# Patient Record
Sex: Female | Born: 1950
Health system: Southern US, Community
[De-identification: ages and names within clinical notes are randomized; demographics above are authoritative.]

## PROBLEM LIST (undated history)

## (undated) DIAGNOSIS — F329 Major depressive disorder, single episode, unspecified: Secondary | ICD-10-CM

## (undated) DIAGNOSIS — M26629 Arthralgia of temporomandibular joint, unspecified side: Secondary | ICD-10-CM

## (undated) DIAGNOSIS — I82409 Acute embolism and thrombosis of unspecified deep veins of unspecified lower extremity: Secondary | ICD-10-CM

## (undated) DIAGNOSIS — F32A Depression, unspecified: Secondary | ICD-10-CM

## (undated) DIAGNOSIS — J309 Allergic rhinitis, unspecified: Secondary | ICD-10-CM

## (undated) DIAGNOSIS — K5790 Diverticulosis of intestine, part unspecified, without perforation or abscess without bleeding: Secondary | ICD-10-CM

## (undated) DIAGNOSIS — E559 Vitamin D deficiency, unspecified: Secondary | ICD-10-CM

## (undated) DIAGNOSIS — F419 Anxiety disorder, unspecified: Secondary | ICD-10-CM

## (undated) DIAGNOSIS — M1990 Unspecified osteoarthritis, unspecified site: Secondary | ICD-10-CM

## (undated) DIAGNOSIS — K589 Irritable bowel syndrome without diarrhea: Secondary | ICD-10-CM

## (undated) DIAGNOSIS — C4491 Basal cell carcinoma of skin, unspecified: Secondary | ICD-10-CM

## (undated) HISTORY — DX: Unspecified osteoarthritis, unspecified site: M19.90

## (undated) HISTORY — DX: Basal cell carcinoma of skin, unspecified: C44.91

## (undated) HISTORY — PX: TUBAL LIGATION: SHX77

## (undated) HISTORY — DX: Diverticulosis of intestine, part unspecified, without perforation or abscess without bleeding: K57.90

## (undated) HISTORY — DX: Acute embolism and thrombosis of unspecified deep veins of unspecified lower extremity: I82.409

## (undated) HISTORY — PX: SHOULDER SURGERY: SHX246

## (undated) HISTORY — DX: Arthralgia of temporomandibular joint, unspecified side: M26.629

## (undated) HISTORY — PX: FOOT SURGERY: SHX648

## (undated) HISTORY — PX: BREAST ENHANCEMENT SURGERY: SHX7

## (undated) HISTORY — DX: Anxiety disorder, unspecified: F41.9

## (undated) HISTORY — DX: Irritable bowel syndrome, unspecified: K58.9

## (undated) HISTORY — DX: Vitamin D deficiency, unspecified: E55.9

## (undated) HISTORY — PX: BREAST BIOPSY: SHX20

## (undated) HISTORY — DX: Allergic rhinitis, unspecified: J30.9

## (undated) HISTORY — DX: Depression, unspecified: F32.A

## (undated) HISTORY — DX: Major depressive disorder, single episode, unspecified: F32.9

## (undated) HISTORY — PX: VESICOVAGINAL FISTULA CLOSURE W/ TAH: SUR271

## (undated) HISTORY — PX: OTHER SURGICAL HISTORY: SHX169

---

## 1998-07-11 ENCOUNTER — Ambulatory Visit (HOSPITAL_COMMUNITY): Admission: RE | Admit: 1998-07-11 | Discharge: 1998-07-11 | Payer: Self-pay | Admitting: Ophthalmology

## 1999-01-12 ENCOUNTER — Encounter: Payer: Self-pay | Admitting: *Deleted

## 1999-01-12 ENCOUNTER — Inpatient Hospital Stay (HOSPITAL_COMMUNITY): Admission: EM | Admit: 1999-01-12 | Discharge: 1999-01-13 | Payer: Self-pay | Admitting: Emergency Medicine

## 2000-11-30 ENCOUNTER — Encounter: Payer: Self-pay | Admitting: Family Medicine

## 2000-11-30 ENCOUNTER — Ambulatory Visit (HOSPITAL_COMMUNITY): Admission: RE | Admit: 2000-11-30 | Discharge: 2000-11-30 | Payer: Self-pay | Admitting: Family Medicine

## 2001-03-29 ENCOUNTER — Other Ambulatory Visit: Admission: RE | Admit: 2001-03-29 | Discharge: 2001-03-29 | Payer: Self-pay | Admitting: *Deleted

## 2001-04-18 ENCOUNTER — Encounter: Payer: Self-pay | Admitting: Oral Surgery

## 2001-04-18 ENCOUNTER — Ambulatory Visit (HOSPITAL_COMMUNITY): Admission: RE | Admit: 2001-04-18 | Discharge: 2001-04-18 | Payer: Self-pay | Admitting: Oral Surgery

## 2002-05-15 ENCOUNTER — Observation Stay (HOSPITAL_COMMUNITY): Admission: RE | Admit: 2002-05-15 | Discharge: 2002-05-16 | Payer: Self-pay | Admitting: Orthopedic Surgery

## 2002-07-03 ENCOUNTER — Inpatient Hospital Stay (HOSPITAL_COMMUNITY): Admission: RE | Admit: 2002-07-03 | Discharge: 2002-07-06 | Payer: Self-pay | Admitting: Family Medicine

## 2002-07-03 ENCOUNTER — Encounter: Payer: Self-pay | Admitting: Emergency Medicine

## 2002-07-04 ENCOUNTER — Encounter: Payer: Self-pay | Admitting: Internal Medicine

## 2002-07-06 ENCOUNTER — Encounter: Payer: Self-pay | Admitting: Internal Medicine

## 2002-07-13 ENCOUNTER — Other Ambulatory Visit: Admission: RE | Admit: 2002-07-13 | Discharge: 2002-07-13 | Payer: Self-pay | Admitting: Family Medicine

## 2002-07-31 ENCOUNTER — Encounter: Payer: Self-pay | Admitting: Family Medicine

## 2002-07-31 ENCOUNTER — Ambulatory Visit (HOSPITAL_COMMUNITY): Admission: RE | Admit: 2002-07-31 | Discharge: 2002-07-31 | Payer: Self-pay | Admitting: Family Medicine

## 2002-08-17 ENCOUNTER — Encounter: Payer: Self-pay | Admitting: Gastroenterology

## 2002-08-17 ENCOUNTER — Ambulatory Visit (HOSPITAL_COMMUNITY): Admission: RE | Admit: 2002-08-17 | Discharge: 2002-08-17 | Payer: Self-pay | Admitting: Gastroenterology

## 2002-10-05 ENCOUNTER — Ambulatory Visit (HOSPITAL_COMMUNITY): Admission: EM | Admit: 2002-10-05 | Discharge: 2002-10-05 | Payer: Self-pay | Admitting: Emergency Medicine

## 2002-10-05 ENCOUNTER — Encounter (INDEPENDENT_AMBULATORY_CARE_PROVIDER_SITE_OTHER): Payer: Self-pay | Admitting: Specialist

## 2002-11-09 ENCOUNTER — Ambulatory Visit (HOSPITAL_COMMUNITY): Admission: RE | Admit: 2002-11-09 | Discharge: 2002-11-09 | Payer: Self-pay | Admitting: Gastroenterology

## 2003-12-05 ENCOUNTER — Emergency Department (HOSPITAL_COMMUNITY): Admission: EM | Admit: 2003-12-05 | Discharge: 2003-12-05 | Payer: Self-pay | Admitting: Family Medicine

## 2008-03-02 ENCOUNTER — Emergency Department (HOSPITAL_COMMUNITY): Admission: EM | Admit: 2008-03-02 | Discharge: 2008-03-02 | Payer: Self-pay | Admitting: Emergency Medicine

## 2009-10-07 ENCOUNTER — Other Ambulatory Visit: Admission: RE | Admit: 2009-10-07 | Discharge: 2009-10-07 | Payer: Self-pay | Admitting: Family Medicine

## 2010-09-19 ENCOUNTER — Encounter: Payer: Self-pay | Admitting: Family Medicine

## 2010-10-20 ENCOUNTER — Emergency Department (HOSPITAL_COMMUNITY): Payer: BC Managed Care – PPO

## 2010-10-20 ENCOUNTER — Inpatient Hospital Stay (HOSPITAL_COMMUNITY): Payer: BC Managed Care – PPO

## 2010-10-20 ENCOUNTER — Inpatient Hospital Stay (HOSPITAL_COMMUNITY)
Admission: EM | Admit: 2010-10-20 | Discharge: 2010-10-22 | DRG: 181 | Disposition: A | Discharge status: Home / Self Care | Payer: BC Managed Care – PPO | Attending: Surgery | Admitting: Surgery

## 2010-10-20 DIAGNOSIS — K589 Irritable bowel syndrome without diarrhea: Secondary | ICD-10-CM | POA: Diagnosis present

## 2010-10-20 DIAGNOSIS — F3289 Other specified depressive episodes: Secondary | ICD-10-CM | POA: Diagnosis present

## 2010-10-20 DIAGNOSIS — K56609 Unspecified intestinal obstruction, unspecified as to partial versus complete obstruction: Principal | ICD-10-CM | POA: Diagnosis present

## 2010-10-20 DIAGNOSIS — F329 Major depressive disorder, single episode, unspecified: Secondary | ICD-10-CM | POA: Diagnosis present

## 2010-10-20 DIAGNOSIS — G5760 Lesion of plantar nerve, unspecified lower limb: Secondary | ICD-10-CM | POA: Diagnosis present

## 2010-10-20 LAB — COMPREHENSIVE METABOLIC PANEL
ALT: 27 U/L (ref 0–35)
AST: 28 U/L (ref 0–37)
Alkaline Phosphatase: 101 U/L (ref 39–117)
CO2: 24 mEq/L (ref 19–32)
Chloride: 105 mEq/L (ref 96–112)
GFR calc Af Amer: >60 mL/min (ref >60)
GFR calc non Af Amer: >60 mL/min (ref >60)
Potassium: 3.8 mEq/L (ref 3.5–5.1)
Sodium: 143 mEq/L (ref 135–145)
Total Bilirubin: 0.6 mg/dL (ref 0.3–1.2)

## 2010-10-20 LAB — CBC
HCT: 43 % (ref 36.0–46.0)
Hemoglobin: 14.5 g/dL (ref 12.0–15.0)
MCH: 29.3 pg (ref 26.0–34.0)
MCHC: 33.7 g/dL (ref 30.0–36.0)
MCV: 86.9 fL (ref 78.0–100.0)
Platelets: 293 10*3/uL (ref 150–400)
RBC: 4.95 MIL/uL (ref 3.87–5.11)
RDW: 12.8 % (ref 11.5–15.5)
WBC: 10.6 10*3/uL — ABNORMAL HIGH (ref 4.0–10.5)

## 2010-10-20 LAB — DIFFERENTIAL
Basophils Absolute: 0.1 10*3/uL (ref 0.0–0.1)
Basophils Relative: 1 % (ref 0–1)
Eosinophils Absolute: 0.1 10*3/uL (ref 0.0–0.7)
Eosinophils Relative: 1 % (ref 0–5)
Lymphocytes Relative: 21 % (ref 12–46)
Lymphs Abs: 2.2 10*3/uL (ref 0.7–4.0)
Monocytes Absolute: 0.5 10*3/uL (ref 0.1–1.0)
Monocytes Relative: 4 % (ref 3–12)
Neutro Abs: 7.8 10*3/uL — ABNORMAL HIGH (ref 1.7–7.7)
Neutrophils Relative %: 74 % (ref 43–77)

## 2010-10-20 LAB — URINE MICROSCOPIC-ADD ON

## 2010-10-20 LAB — URINALYSIS, ROUTINE W REFLEX MICROSCOPIC
Bilirubin Urine: NEGATIVE
Protein, ur: NEGATIVE mg/dL
Specific Gravity, Urine: 1.01 (ref 1.005–1.030)
Urobilinogen, UA: 0.2 mg/dL (ref 0.0–1.0)

## 2010-10-20 MED ORDER — IOHEXOL 300 MG/ML  SOLN: 80.0000 mL | Freq: Once | INTRAMUSCULAR | Status: AC | PRN

## 2010-10-21 ENCOUNTER — Inpatient Hospital Stay (HOSPITAL_COMMUNITY): Payer: BC Managed Care – PPO

## 2010-10-21 LAB — CBC
HCT: 41.3 % (ref 36.0–46.0)
Platelets: 267 10*3/uL (ref 150–400)
RDW: 12.8 % (ref 11.5–15.5)
WBC: 9.8 10*3/uL (ref 4.0–10.5)

## 2010-10-21 LAB — BASIC METABOLIC PANEL
GFR calc non Af Amer: >60 mL/min (ref >60)
Glucose, Bld: 101 mg/dL — ABNORMAL HIGH (ref 70–99)
Potassium: 3.8 mEq/L (ref 3.5–5.1)
Sodium: 141 mEq/L (ref 135–145)

## 2010-10-21 LAB — MAGNESIUM: Magnesium: 2.3 mg/dL (ref 1.5–2.5)

## 2010-10-21 NOTE — H&P (Signed)
NAMECESILY, CUOCO              ACCOUNT NO.:  [id number]  MEDICAL RECORD NO.:  [id number]           PATIENT TYPE:  E  LOCATION:  [facility]                         FACILITY:  [facility]  PHYSICIAN:  Abigail Miyamoto, M.D. DATE OF BIRTH:  1951-06-01  DATE OF ADMISSION:  10/20/2010 DATE OF DISCHARGE:                             HISTORY & PHYSICAL   REFERRING PHYSICIAN:  Marisa Severin, MD  PRIMARY CARE PHYSICIAN:  Stacie Acres. White, MD  CHIEF COMPLAINT:  Abdominal pain, nausea, vomiting, abdominal distention since 6:30 p.m. yesterday.  BRIEF HISTORY:  The patient is a 60 year old white female who complains of abdominal epigastric pain that started after eating last night.  She describes it as a constant pain.  Initially, she got up and walked and tried a coke, burping helped, but then nausea and vomiting started around 11:00 p.m., she had nothing further except water.  She has had ongoing nausea and vomiting since 11:30 last night.  She has had no change in abdominal pain.  Her nausea and pain were improved after treatment in the ER with Zofran and fentanyl.  She has had a total of 12 mg of Zofran and 100 mcg of fentanyl.  She vomited during the CT, this is the largest volume so far and currently actually feels better.  She has a prior history of small-bowel obstruction, which she says the pain came in waves.  It was reported as a partial small bowel obstruction.  She denies any shortness of breath or chest discomfort.  PAST MEDICAL HISTORY: 1. Recent diagnosis of irritable bowel syndrome. 2. Prior small-bowel obstruction in November 09, 2002 with colonoscopy by     Dr. Matthias Hughs showed mild sigmoid diverticuli, internal hemorrhoids.     She had transaminitis with her small-bowel obstruction.  She has     undergone another colonoscopy in October 05, 2002, which showed     internal and external hemorrhoids, occasional erosions an ulcer.     Biopsies were obtained were reportedly negative.  She  most recently     had a colonoscopy in November 2010 at the [facility]     [facility].  She had some polyps removed which were reported     normal. 3. History of depression, on Zoloft. 4. History of vertigo.  PAST SURGICAL HISTORY: 1. She had a partial hysterectomy in 1985.  She had a tubal ligation     prior to that.  She has had right shoulder surgery. 2. Bilateral breast implants in 1979, which were later removed     secondary to leaks. 3. She had a spur of right fourth and fifth toes removed 4. She has had shoulder surgery on the right for spurs. 5. She has a Morton neuroma in the right foot.  FAMILY HISTORY:  She has one sister with a history of diverticulitis. One sister died at age 70 months with colitis.  One sister died of a "heart valve" rupture.  One sister died of unknown causes, perhaps an MI.  She has one brother deceased with liver cancer and one brother who died with lung cancer and mets.  Her mother  died of uterine cancer and her father died of lung cancer.  SOCIAL HISTORY:  She is married.  She has her own business.  Tobacco never.  Alcohol never.  Drugs never.  REVIEW OF SYSTEMS:  FEVER:  None.  SKIN:  No changes.  PSYCH:  No new changes.  Weight, she has changed her diet and has been down 12 pounds over a 4-week period.  CV:  She has a history of vertigo the last episode was 2 months ago.  No history of syncope, presyncope.  No history of stroke or seizure.  No headaches.  PULMONARY:  No orthopnea. No PND.  No dyspnea on exertion.  She does snore a bit, but has no apnea.  No coughing or wheezing.  No recent URI.  CARDIAC:  She has occasional palpitation.  She has undergone workup for that which included a stress test and monitoring, which were negative.  GI is positive for GERD currently, normally she does not have it.  Positive for nausea and vomiting starting last night.  Positive for occasional diarrhea and constipation.  No blood in her  stool.  Her last BM was sometime last night.  GU:  No trouble voiding.  LOWER EXTREMITIES:  No edema.  No claudication.  MUSCULOSKELETAL:  She has some arthritis in her hands.  CURRENT MEDICATIONS:  Zoloft 25 mg daily.  ALLERGIES:  MORPHINE causes her to be jumpy and irritable; TORADOL causes nausea and talks out of her head; PERCOCET causes nausea.  PHYSICAL EXAMINATION:  GENERAL:  This is well-nourished, well-developed white female, currently in no acute distress. VITAL SIGNS:  Temperature on admission at 5:47 was 98.4, heart rate was 102, blood pressure 136/62, sats of 97% on room air.  Currently her heart rate is 93, blood pressure is 116/65, respiratory rate is 20, sats 99% on room air. HEENT:  HEAD:  Normocephalic.  Ears, nose, throat, and mouth are all normal. NECK:  Trachea is in the midline.  Thyroid was nonpalpable.  There is no bruits.  No JVD. LUNGS:  Respiratory effort was normal.  Chest was clear to auscultation. CARDIAC:  Normal S1 and S2. ABDOMEN:  Rare bowel sounds.  Abdomen is nondistended, is not tender to palpation.  She has the same discomfort over the epigastric area, but is not made worse by palpation.  No masses, hernia, or abscess were noted. GENITALIA:  Deferred. RECTAL:  Deferred. LYMPHADENOPATHY:  No femoral lymphadenopathy noted. MUSCULOSKELETAL: No significant changes noted. SKIN:  No changes. NEUROLOGIC:  She is alert, oriented, cooperative.  Cranial nerves are intact. PSYCH:  Normal affect.  LABORATORY DATA:  White count is 10.6, hemoglobin is 14.5, hematocrit is 43, platelets 293,000.  Sodium is 143, potassium is 3.8, chloride is 105, CO2 is 24, BUN is 6, creatinine is 0.74, total bilirubin 0.6.  SGPT is 27, SGOT is 28, alk phos is 101, lipase is 21.  UA is positive for some bacteria and 0-2 white cells and 3-6 red cells per high-power field.  DIAGNOSTIC STUDIES:  CT scan of the abdomen and pelvis shows atelectasis at the lung bases.  The  liver, gallbladder, spleen, pancreas, and adrenals were normal.  There is a small hiatal hernia.  Duodenum was okay.  There is dilatation of small bowel with a left lower quadrant transition point here.  Terminal ileum was decompressed.  There is a small amount of free fluid in the pelvis.  There is colonic diverticulosis without diverticulitis.  IMPRESSION: 1. Small-bowel obstruction with prior small-bowel obstruction  in 2004. 2. Diverticulosis without diverticulitis. 3. History of tubal ligation and hysterectomy. 4. History of depression. 5. History of vertigo. 6. History of shoulder, breast, and toe surgery.  PLAN: 1. Bowel rest. 2. They failed to place an NG earlier, we will try this again later.     IV hydration, H2 blockers.  We will recheck her films and labs in     the morning with further workup and evaluation as indicated.     Eber Hong, P.A.   ______________________________ Abigail Miyamoto, M.D.    WDJ/MEDQ  D:  10/20/2010  T:  10/20/2010  Job:  [id number]  cc:   Stacie Acres. Cliffton Asters, M.D.  Electronically Signed by Sherrie George P.A. on 10/20/2010 04:31:39 PM Electronically Signed by Abigail Miyamoto M.D. on 10/21/2010 01:37:16 PM

## 2010-10-22 LAB — URINE CULTURE: Culture  Setup Time: 201202211543

## 2010-11-13 NOTE — Discharge Summary (Signed)
  [name], [name]              ACCOUNT NO.:  [id number]  MEDICAL RECORD NO.:  [id number]           PATIENT TYPE:  I  LOCATION:  5150                         FACILITY:  [facility]  PHYSICIAN:  [name], M.D. DATE OF BIRTH:  Nov 22, 1950  DATE OF ADMISSION:  10/20/2010 DATE OF DISCHARGE:  10/22/2010                              DISCHARGE SUMMARY   HISTORY OF PRESENT ILLNESS:  [name].  She presented to the emergency department where a CT scan was ordered showing evidence of at least partial small bowel obstruction.  She has previously had this back in 2004 and again recovered without surgical intervention. After evaluation in the emergency department, a decision was made to admit the patient for observation and possible surgical intervention.  SUMMARY OF HOSPITAL COURSE:  The patient was admitted on October 20, 2010.  An NG tube was placed and it hooked to low intermittent wall suction.  The patient was otherwise placed at bowel rest.  Followup x- rays the next day showed significant improvement and decompression of the small bowel loops.  The patient was passing gas and having evidence of improved bowel function.  Her x-ray did appear to show significant amount of retained stool throughout the colon.  The patient's NG tube was subsequently discontinued.  She was started on liquid diet.  She was given a suppository and subsequently a dose of MiraLax, which ultimately allowed the patient to have a significantly large bowel movement as of today.  After her significant bowel movement and continued flatus, the patient remains feeling well without nausea or [name].  At this point, she feels as well as [place] that she is stable for discharge home.  DISCHARGE DIAGNOSIS:  Partial small bowel obstruction - resolved.  PLAN:  The patient will be discharged home today.  She is instructed to follow up  with her primary care physician for any regular medical problems.  Certainly if in the next few days, she develops recurrent symptoms consistent with obstructive findings then she needs to contact our office or return to the emergency department immediately. Otherwise, her discharge medications will include Zoloft daily.     Joy El, PA-C   ______________________________ [name], M.D.    KB/MEDQ  D:  10/22/2010  T:  10/23/2010  Job:  [id number]  Electronically Signed by [name]  on 11/10/2010 11:00:21 AM Electronically Signed by [name] M.D. on 11/13/2010 08:45:21 AM

## 2011-01-15 NOTE — Consult Note (Signed)
NAME:  [name], [name]                        ACCOUNT NO.:  [id number]   MEDICAL RECORD NO.:  [id number]                   PATIENT TYPE:  INP   LOCATION:  0460                                 FACILITY:  [facility]   PHYSICIAN:  Bernette Redbird, M.D.                DATE OF BIRTH:  [birth year]   DATE OF CONSULTATION:  07/04/2002  DATE OF DISCHARGE:                                   CONSULTATION   REASON FOR CONSULTATION:  Dr. Nehemiah Settle of the [facility] hospitalists asked me to  see this 60 year old Caucasian female because of abdominal pain and abnormal  radiographic findings on a CT scan.  She also has elevated liver  chemistries.   HISTORY OF PRESENT ILLNESS:  The patient was admitted to the hospital  yesterday following a several day prodrome of low abdominal pain, mostly in  the infraumbilical area, which began fairly abruptly midday Sunday and  increased after lunch, associated with some degree of abdominal distention,  but no significant nausea, and certainly no vomiting.  It was difficult for  her to stand up straight or to walk around.  She had some bowel movements  which were quite painful, although after the bowel movements the pain was  transiently relieved.  The same is true that there was some degree of relief  following urination.  At this time the patient did not develop any evident  fevers or chills.  There was really no frank diarrhea.   The patient took some Darvocet and received partial relief to the point  where she was able to go to work the next morning at [employer].  However,  as the morning went on the pain came back and intensified.  She had to go  home a bit early from work and then yesterday morning she again had very  severe symptoms, made worse following breakfast, and presented to the office  of her primary physician, Dr. Charlott Rakes, where she was noted to have  abnormal blood work including a white count of 13,600 (72% polys) and  elevation of liver chemistries  (mixed hepatocellular and cholestatic pattern  with an alkaline phosphatase of 208 with normal being up to 136 and SGOT of  212 and SGPT of 254).   She was sent to the hospital where an abdominal ultrasound was obtained  which showed no gallstones or any other obvious liver abnormalities but  there was a small amount of perihepatic ascites.  She then underwent an  abdominal/pelvic CT scan which showed some dilatation of small bowel loops,  but passage of the contrast into the colon (and indeed she has passed the  contrast rectally through the course of the night), some questionable  thickening of the distal ileum (not terminal ileum), although on further  review with the radiologist this appears to be artifactual since it is not  circumferential thickening, no obvious hepatic parenchymal abnormalities,  varices, etc., some mild  to moderate fluid in the pelvis and a little bit  around the liver, and diverticular disease without overt diverticulitis.   Since hospital admission the patient has been on Cipro and Flagyl and  overnight she has had a marked (80%) reduction in her pain, although there  is still a little bit of soreness.  The patient has been seen in consultation by Dr. Kendrick Ranch of general  surgery.  His impression was probably a resolving SBO secondary to  inflammation of the distal ileum.  Of note, the patient denies any prodromal abdominal symptoms prior to the  acute onset of symptoms three days ago.  There is no antecedent history of  chronic diarrhea, intermittent abdominal pain, loss of appetite, loss of  weight (actually has gained about 20 pounds over the past year), etc.  The patient has had a maximum temperature of 100.3 degrees since admission.  She has been afebrile more recently.   ALLERGIES:  No known drug allergies.   MEDICATIONS:  Occasional Advil.   PAST SURGICAL HISTORY:  1. Remote open tubal ligation.  2. Remote abdominal hysterectomy without  oophorectomy.  3. Shoulder surgery two months ago.   PAST MEDICAL HISTORY:  None, specifically, no chronic cardiopulmonary  disease, diabetes, or hypertension.   HABITS:  Nonsmoker, nondrinker.   FAMILY HISTORY:  Pertinent for diverticulitis in one of her sisters,  negative for colon cancer, IBS, gallstones.  One of her brothers has ulcers.   SOCIAL HISTORY:  Married.  Works part-time at [employer].   REVIEW OF SYSTEMS:  The patient reports two bowel movements a day which is  her normal baseline.  No rectal bleeding.  Remainder of ROS is per HPI.   PHYSICAL EXAMINATION:  GENERAL:  Pertinent for heme-negative stool detected  yesterday by Dr. Nehemiah Settle and minimal suprapubic tenderness on examination at  present.  VITAL SIGNS:  The patient is afebrile.  HEENT:  Anicteric and without overt pallor.  CHEST:  Clear.  HEART:  Without gallops, rubs, murmurs, clicks, or arrhythmias.  ABDOMEN:  Normal, mildly reduced bowel sounds, nonobstructive in character.  There is some mild scattered tympany, nothing impressive.  The abdomen is  slightly protuberant, mostly due to adiposity, although the patient thinks  she is a little bit distended beyond her usual baseline.  No  hepatosplenomegaly is appreciated.  No discreet mass effect is noted.  There  is some suprapubic subjective tenderness without significant guarding,  without peritoneal findings.  RECTAL:  By Dr. Nehemiah Settle yesterday on admission showed heme-negative stool and  was not repeated.   LABORATORIES:  Please see HPI for laboratories yesterday in Dr. Lucilla Lame  office.  White count today is 9500 with a hemoglobin of 12.1 and an MCV of  87 with a normal RDW.  Platelets 266,000.  Metabolic panel pertinent for  post hydration albumin of 3.0 (was 4.1 on admission yesterday), AST 52, ALT  106 (both improved from yesterday when they were 146 and 179, respectively). Alkaline phosphatase today is normal at 101.  Yesterday it was slightly   elevated at 148.  Amylase normal.  Lipase below normal at 14.  Hepatitis  studies negative for hepatitis B surface antigen, hepatitis A IgM antibody,  and hepatitis C antibody.   IMPRESSION:  1. Abdominal pain which I think is probably due to resolving acute     diverticulitis with an associated reactive ileus.  I doubt a bowel     obstruction is present since the contrast moved into the colon and  the     patient never was without bowel movements during the time of her illness.     There was evidence of some degree of small intestinal dilatation and I     suspect there probably was an element of a reactive ileus to account for     that.  2. Elevated liver chemistries, predominantly hepatocellular pattern without     overt fatty infiltration of the liver noted on either ultrasound or CT     scan.  Note that worrisome features such as scalloping of the liver     surface, splenomegaly, or varices are absent.  3. Minimal ascites, primarily in the pelvis which I take to most likely be a     reactive phenomenon to the patient's presumed diverticulitis.  I doubt     this is primary hepatic ascites.  4. Questionable ileal thickening on CT scan which I think is probably     artifactual in character.  5. Diverticulosis by CT scan without frank radiographic diverticulitis.    RECOMMENDATIONS:  1. Okay to initiate clear liquid diet and advance to a low residue diet     tomorrow if doing okay.  2. Continue Cipro and Flagyl.  Could probably change to oral antibiotics     tomorrow if doing okay.  3. Check further for primary liver diseases with iron studies and an ANA     (doubt).  4. I am hopeful that the patient might be able to go home in a couple of     days if she shows continued improvement and can tolerate a diet.  5. I would favor a follow-up CT scan of the abdomen and pelvis in about     three weeks to confirm resolution of the pelvic fluid and to confirm     normal appearance of the  small bowel which if abnormal might generate the     need for a formal small bowel series.  6. The patient is a candidate for eventual screening colonoscopy.  She has     not had sigmoidoscopy in the past.  Even though there are no special risk     factors or worrisome features, I think that it would be prudent to help     exclude colorectal neoplasia, precancerous lesions, and smoldering     inflammatory bowel disease.                                               Bernette Redbird, M.D.    RB/MEDQ  D:  07/04/2002  T:  07/04/2002  Job:  [id number]   cc:   Stacie Acres. Cliffton Asters, M.D.   Timothy E. Earlene Plater, M.D.  [address] [place]  [place] [address]  Fax: [phone]

## 2011-01-15 NOTE — Op Note (Signed)
   NAME:  [name], PEDRETTI                        ACCOUNT NO.:  [id number]   MEDICAL RECORD NO.:  [id number]                   PATIENT TYPE:  OBV   LOCATION:  0450                                 FACILITY:  [facility]   PHYSICIAN:  Georges Lynch. Darrelyn Hillock, M.D.             DATE OF BIRTH:  11/28/1950   DATE OF PROCEDURE:  DATE OF DISCHARGE:                                 OPERATIVE REPORT   SURGEON:  Ronald A. Darrelyn Hillock, M.D.   Threasa HeadsWynona Canes.   PREOPERATIVE DIAGNOSES:  1. Small tear of the rotator cuff tendon, right shoulder.  2. Severe impingement syndrome, right shoulder.   POSTOPERATIVE DIAGNOSES:  1. Small tear of the rotator cuff tendon, right shoulder.  2. Severe impingement syndrome, right shoulder.   OPERATION:  1. Partial acrominonectomy and acromioplasty.  2. Excision of subdeltoid bursa, right shoulder.  3. Repair  of a small tear of the rotator cuff tendon. It was a longitudinal     tear, right shoulder.   DESCRIPTION OF PROCEDURE:  Under general anesthesia, routine orthopedic  prepping and draping of the  right shoulder was carried out. She had 1 gm of  IV Ancef preoperatively.   An incision was made over the anterior aspect of the right shoulder and the  bleeders were identified and cauterized. At this time I detached the deltoid  tendon from the acromion.  Following this I noticed she had severe  impingement. Her impingement was so severe that I could barely get a Cobb  elevator between her humeral head and her acromion.   I then went on  and protected the rotator cuff and did a partial  acromionectomy with the oscillating saw then utilized the bur to even out  the undersurface of the  acromion.  Following this I excised the subdeltoid  bursa, and then I noted by externally rotating the shoulder there was a  longitudinal tear in the rotator cuff, which actually was secondary to her  severe impingement. I then utilized a few transverse sutures to repair that.  No  graft was necessary.   I thoroughly irrigated out the shoulder, reapproximated the deltoid tendon  and the muscle in the usual fashion. The main part of the  wound was closed  in the usual fashion. Sterile dressings were applied. The patient left the  operating room in satisfactory condition.                                                    Ronald A. Darrelyn Hillock, M.D.    RAG/MEDQ  D:  05/15/2002  T:  05/16/2002  Job:  [id number]

## 2011-01-15 NOTE — Consult Note (Signed)
NAME:  AVANA, KREISER                        ACCOUNT NO.:  [id number]   MEDICAL RECORD NO.:  [id number]                   PATIENT TYPE:  EMS   LOCATION:  ED                                   [facility]   PHYSICIAN:  Petra Kuba, M.D.                 DATE OF BIRTH:  1951-08-26   DATE OF CONSULTATION:  DATE OF DISCHARGE:                                   CONSULTATION   HISTORY:  A patient of Dr. Matthias Hughs seen at the request of the ER physician  for some bloody mucus and some abdominal pain.  The patient presented to the  ER after talking to Dr. Matthias Hughs.  She had had a recent hospitalization back  in November for questionable diverticulitis.  She says she had recovered  from that and has seen Dr. Matthias Hughs in the office a few times.  There was  some question of abnormal liver tests which has since normalized.  She says  the pain that she is having now is different from her previous pain and she  was not having any blood or diarrhea before.  She did have a bout of  diarrhea two days ago, and about two weeks ago her and her husband both had  a seemingly 24-hour gastroenteritis.  She has not been on any antibiotics  that she knows of since September and has not seen any other obvious sick  contacts.  She has not had any previous GI procedures but her CAT scans with  her previous hospitalization.   PAST MEDICAL HISTORY:  Pertinent for multiple surgeries including partial  hysterectomy, she did leave her ovaries, breast surgery, and some moles  removed.   MEDICATIONS:  1. Advil.  2. Aspirin.  3. She does have an estrogen patch.   SOCIAL HISTORY:  Does not smoke or drink.   ALLERGIES:  MORPHINE, PERCOCET, and TORADOL.  She says she can take Demerol  and Valium.   FAMILY HISTORY:  Interesting for a sister with diverticulitis and a sister  who died at 43 months with some sort of colitis, sounds almost infectious.   REVIEW OF SYSTEMS:  Negative except as above, and she is  feeling better.   PHYSICAL EXAMINATION:  VITAL SIGNS:  Stable, afebrile, in no acute distress.  LUNGS:  Clear.  HEART:  Regular rate and rhythm.  ABDOMEN:  Soft and nontender.  RECTAL:  Trace guaiac-positive as per the ER physician.   LABORATORY DATA:  Completely normal including CBC, liver tests and amylase.   ASSESSMENT:  Acute onset of abdominal pain with bloody diarrhea and mucus.  Questionable infectious ischemia, etc.    PLAN:  The risks, benefits, methods of flexible sigmoidoscopy were discussed  with her and her husband.  We will proceed today with further workup and  plans and recommendations pending those findings.  Please see that dictation  per other recommendations.  Petra Kuba, M.D.    MEM/MEDQ  D:  10/05/2002  T:  10/05/2002  Job:  [id number]   cc:   Bernette Redbird, M.D.  [address]  [place], [place] [address]  Fax: [phone]   Stacie Acres. White, M.D.  [address]  [place]  [place] [address]  Fax: [phone]

## 2011-01-15 NOTE — H&P (Signed)
[name], [name]                          ACCOUNT NO.:  [id number]   MEDICAL RECORD NO.:  [id number]                   PATIENT TYPE:   LOCATION:                                       FACILITY:   PHYSICIAN:  [id number]                                DATE OF BIRTH:   DATE OF ADMISSION:  05/15/2002  DATE OF DISCHARGE:                                HISTORY & PHYSICAL   CHIEF COMPLAINT:  Right shoulder pain.   HISTORY OF PRESENT ILLNESS:  The patient has been complaining of right  shoulder pain for about the past four months.  She states that she was  loading about 15 tons of rock onto her bank to prevent her bank from running  down in the rain.  She also goes on to state that she has been lifting a  heavy father-in-law.  She denies any numbness or tingling distally.  She  denies any neck or axillary pain.  Her main complaint is pain on range of  motion of her shoulder.  Risks and benefits of open decompression of her  right shoulder have been discussed with the patient, and the patient wishes  to proceed.   PAST MEDICAL HISTORY:  No past medical history.   PAST SURGICAL HISTORY:  1. Breast implants.  2. Removal of breast implants.  3. Hysterectomy.  4. BTL.  5. Operations on two toes on her right foot.  6. Cyst removal of right breast.   MEDICATIONS:  No current medications.   ALLERGIES:  OXYCODONE causes nausea, MORPHINE makes her jumpy.   SOCIAL HISTORY:  The patient denies any alcohol or tobacco use.  She is  married.  She lives in a one-story house and her husband will be her  caregiver after surgery.   FAMILY HISTORY:  Mother deceased of uterine cancer.  Father deceased of lung  cancer and osteoarthritis.   REVIEW OF SYSTEMS:  GENERAL:  Denies fevers, chills, night sweats, bleeding,  __________.  Denies blurring, double vision, seizures, headaches, paralysis.  RESPIRATORY:  Denies shortness of breath, productive cough, hemoptysis.  CARDIOVASCULAR:  Denies chest  pain, angina, orthopnea.  GI:  Positive  diarrhea.  Denies nausea, vomiting, constipation, melena, bloody stools.  GU:  Denies dysuria, hematuria or discharge.  MUSCULOSKELETAL:  As pertinent  to HPI.   PHYSICAL EXAMINATION:  VITAL SIGNS:  Blood pressure 96/70, pulse 80,  respirations 16.  GENERAL:  A well-developed, well-nourished 60 year old female.  HEENT:  Normocephalic, atraumatic.  Pupils equal, round, reactive to light.  NECK:  Supple, no carotid bruit noted.  CHEST:  Clear to auscultation bilaterally, no wheezes or crackles.  HEART:  Regular rate and rhythm.  No murmurs, rubs, or gallops.  ABDOMEN:  Soft, nontender, nondistended.  Positive bowel sounds in all four  quadrants.  EXTREMITIES:  The patient on range  of motion on the right shoulder has  painful limited adduction on the right shoulder.  She is neurovascularly  intact distally.  No pain on palpation or range of motion of her neck.  SKIN:  No rashes or lesions.   IMAGING STUDIES:  Reveals subacromial subdeltoid bursitis with fraying of  rotator cuff.  No partial or full-thickness tear of rotator cuff.  She has a  lateral pointed acromion with subacromial spur.   IMPRESSION:  Impingement syndrome with rotator cuff fraying.   PLAN:  The patient will be admitted to [facility] on May 15, 2002 and undergo open decompression of the right shoulder with possible  graft (tendon graft).  The patient will also need to be placed on Demerol  p.o. postop as oxycodone causes nausea, and if a PCA is needed she will need  to be placed on Dilaudid PCA because morphine makes her jumpy.     Clarene Reamer, P.A.-C.                   [phone]    SW/MEDQ  D:  05/10/2002  T:  05/10/2002  Job:  [id number]

## 2011-01-15 NOTE — Discharge Summary (Signed)
[name], [name]                        ACCOUNT NO.:  [id number]   MEDICAL RECORD NO.:  [id number]                   PATIENT TYPE:  INP   LOCATION:  0460                                 FACILITY:  [facility]   PHYSICIAN:  Deirdre Peer. Polite, M.D.              DATE OF BIRTH:  11/06/1950   DATE OF ADMISSION:  07/03/2002  DATE OF DISCHARGE:                                 DISCHARGE SUMMARY   DISCHARGE DIAGNOSES:  1. Small-bowel obstruction, resolved, secondary to localized segment of     thick-walled distal ileus.  2. Transaminitis, improved at the time of discharge, AST and ALT of 212 and     254 respectively on admission, at discharge 22 and 48.  3. Abdominal ascites secondary to #1, CA125 ordered for completeness, which     was essentially normal 6.4.  Of note, CT was negative for any other     pathological cause for ascites.   DISCHARGE MEDICATIONS:  1. Cipro 500 mg p.o. b.i.d. x 7 days.  2. Flagyl 500 mg every eight hours x 7 days.  3. Percocet 1 or 2 tablets as needed for pain.  4. Phenergan 25 mg as needed for nausea or vomiting.   CONDITION ON DISCHARGE:  The patient is discharged in stable condition,  tolerating a p.o. diet.   FOLLOW-UP:  The patient will have follow-up with her primary M.D. on  July 13, 2002, at 2 p.m. and also have follow-up with Dr. Matthias Hughs on  August 07, 2002, at 11:45.   CONSULTATIONS:  1. Dr. Earlene Plater, surgery.  2. Dr. Matthias Hughs, gastroenterology.   LABORATORY DATA:  The patient had a CT of the abdomen and pelvis which  showed distal partial small-bowel obstruction secondary to localized segment  of thick-walled distal ileum.  Negative for free air.  Showed moderate  ascites.  Showed some sigmoid diverticulum but no evidence of  diverticulitis.  Abdominal flat plate performed on July 04, 2002, showed  improved partial small-bowel obstruction.   TSH 1.85.  BMET within normal limits.  CBC significant for white count 13.6,  hemoglobin of  14.9.  Urine dipstick performed in the office negative except  for mild ketones.  AST and ALT 212 and 254 respectively, bilirubin 0.5,  amylase 34, calcium 10.7, alkaline phosphatase 208.  The patient had  hepatitis studies A, B, and C which were negative.  The patient had an ANA  which was negative.  Urine culture negative.  CA125 6.4.   HISTORY OF PRESENT ILLNESS:  The patient is a 60 year old white female with  no significant past medical history came to the ED because of abdominal pain  and distention.  The patient had emesis x 1 after drinking p.o. contrast in  the ED.  Evaluation in the ED found the patient to have elevated LFTs with  an abnormal CT.  Admission was deemed necessary for further evaluation and  treatment.  Please see  full dictated H&P for further details of history of  present illness.   PAST MEDICAL HISTORY:  The patient denies diabetes.  Denies CVA.  No lung  disease.   MEDICATIONS:  Occasionally takes Advil for pain.  Darvocet.  Occasionally  takes B12; however, she has no documented deficiency.   SOCIAL HISTORY:  Negative for tobacco, alcohol, or drugs.   PAST SURGICAL HISTORY:  1. Right shoulder surgery secondary to bone spur May 15, 2002.  2. Bilateral breast implants in 1979.  3. Bilateral implants removed approximately nine years ago secondary to     leakage of implants.  4. Hysterectomy in 1985 secondary to fibroids.  5. Tubal ligation in 1980.  6. Bone spurs removed from the fifth digit of the feet bilateral.  7. Breast biopsy/lumpectomy of the right breast in the past which was     benign.   ALLERGIES:  The patient states intolerance to MORPHINE.   FAMILY HISTORY:  Noncontributory.   REVIEW OF SYSTEMS:  Please see dictated H&P.   HOSPITAL COURSE:  1. Abdominal pain and swelling consistent with small-bowel obstruction in a     patient with elevated white blood cells and abnormal CT scan with     localized segment of thick-walled ileum and  moderate ascites:     Differential diagnoses include infectious process versus other     pathological process, i.e., malignancy, as the patient had a significant     amount of ascites in her abdomen.  However, the CT of the abdomen did not     show any other cause for the ascites, i.e., mass, adenopathy, etc.  A     CA125 was ordered as the patient still has her ovaries, which was     essentially normal.  Therefore, it was felt that the patient's ascites     was all secondary to inflammatory process at the distal ileum.  The     patient was admitted to the medicine floor.  Surgery consultation was     obtained because of partial small-bowel obstruction.  The patient was     made n.p.o., given IV hydration, and started on empiric antibiotics in     the form of Cipro and Flagyl.  The patient had a follow-up abdominal flat     plate the next morning which showed moderate improvement of partial small-     bowel obstruction.  It was recommended that the patient have evaluation     by GI.  The patient was seen in consultation by Dr. Matthias Hughs.  It was felt     that the patient's symptoms may be related to diverticulitis that did not     show up on CT scan.  Nonetheless, the patient continued on the current     treatments as outlined.  She had dramatic improvement in her     symptomatology, improvement in her small-bowel obstruction.  No nausea or     vomiting, tolerance of p.o. intake.  The patient will continue a total of     10 days of antibiotics.  She will have a follow-up CT scan in two to     three weeks to insure that the inflamed area in the distal ileum is     resolved and the abdominal ascites is resolved.  Also, the patient will     have a follow-up by Dr. Matthias Hughs.  At this time, the patient is clinical     stable for discharge.  The patient will follow  up with her primary M.D.  2. Transaminitis:  As stated, the patient had elevated LFTs on admission,    AST and ALT in the 200s on  admission.  At the time of discharge AST and     ALT of 22 and 48 respectively.  All studies ordered were negative, i.e.,     hepatitis A, B, and C as well as ANA to rule out autoimmune process.  Of     note, the patient's CT scan of the abdomen had no signs of liver     abnormalities.  There was no cirrhosis and no fatty infiltration.     Presumably, the patient's transaminitis was secondary to inflammatory     process related to the bowel.  Probably recommended that the patient have     follow-up LFTs on an outpatient basis.  3. Abdominal ascites:  This is most likely secondary to inflammatory process     in the small bowel.  As stated, the patient did have a CA125 ordered, and     the test was essentially negative.  The patient's CT scan of the abdomen     did not reveal any other pathological process, i.e., mass or adenopathy.     It is expected that the patient's ascites will resolve with time.  Follow-     up CT is recommended in three weeks or further evaluation to take place     if indicated.  At this time the patient is noted to be stable for     discharge.   ADDENDUM:  It is recommended for the patient to have follow-up LFTs in  approximately two to three weeks and a follow-up CT scan in approximately  three weeks.                                               Deirdre Peer. Polite, M.D.    RDP/MEDQ  D:  07/06/2002  T:  07/06/2002  Job:  [id number]   cc:   Stacie Acres. Cliffton Asters, M.D.

## 2011-01-15 NOTE — Op Note (Signed)
NAME:  [name], [name]                        ACCOUNT NO.:  [id number]   MEDICAL RECORD NO.:  [id number]                   PATIENT TYPE:  EMS   LOCATION:  ED                                   FACILITY:  [facility]   PHYSICIAN:  Petra Kuba, M.D.                 DATE OF BIRTH:  1951/03/14   DATE OF PROCEDURE:  10/05/2002  DATE OF DISCHARGE:                                 OPERATIVE REPORT   PROCEDURE:  Flexible sigmoidoscopy with biopsy.   INDICATION:  Bloody diarrhea, abdominal pain.  Consent was signed after  risks, benefits, methods, options thoroughly discussed with both the patient  and her husband and with Bernette Redbird, M.D. in the office recently.   MEDICINES USED:  Demerol 25, Versed 9.   DESCRIPTION OF PROCEDURE:  Rectal inspection was pertinent for external  hemorrhoids, small.  Digital exam was negative.  The pediatric video  adjustable colonoscope was inserted.  Formed stool was seen throughout her  sigmoid.  We were able to carefully advance to 60 cm.  No blood was seen.  We elected to stop the procedure due to pain and formed stool and some  looping.  There was some left-sided diverticula seen on insertion.  On  withdrawal, a rare erosion and linear ulceration was seen.  Scattered  biopsies were obtained. This was mostly in the descending.  The sigmoid  appeared normal except for the diverticula.  No polypoid lesions or masses  were seen.  However, with the unprepped exam, lesions could have been  missed.  Once back in the rectum, the scope was retroflexed, pertinent for  some internal hemorrhoids.  The scope was straightened.  Air was suctioned  and scope removed.  The patient tolerated the procedure well.  There was no  obvious immediate complication.   ENDOSCOPIC DIAGNOSES:  1. Small internal-external hemorrhoids.  2. Left-sided diverticula.  3. Occasional descending erosion and ulcer, status post biopsy.  4. No blood seen on exam to 60 cm, stopped secondary to  pain and looping and     formed stool.   PLAN:  1. Await pathology.  2. Clear liquid, very slowly advance diet.  3. Call p.r.n.  4. Will discuss __________ with her on a full colonoscopy pending biopsies     in about a week or two per Bernette Redbird, M.D.                                               Petra Kuba, M.D.    MEM/MEDQ  D:  10/05/2002  T:  10/05/2002  Job:  [id number]   cc:   Stacie Acres. White, M.D.  [address]  [place]  [place] [address]  Fax: [phone]   Sheppard Plumber. Earlene Plater, M.D.  [address] [place]  [place] [address]  Fax: [phone]   Bernette Redbird, M.D.  [address]  [place], [place] [address]  Fax: [phone]

## 2011-01-15 NOTE — Op Note (Signed)
NAME:  [name], [name]                        ACCOUNT NO.:  [id number]   MEDICAL RECORD NO.:  [id number]                   PATIENT TYPE:  AMB   LOCATION:  ENDO                                 FACILITY:  [facility]   PHYSICIAN:  Bernette Redbird, M.D.                DATE OF BIRTH:  [birth year]   DATE OF PROCEDURE:  11/09/2002  DATE OF DISCHARGE:                                 OPERATIVE REPORT   PROCEDURE:  Colonoscopy.   INDICATIONS:  The patient is a very pleasant 60 year old female who  presented with an ileus/bowel obstruction picture associated with elevated  liver chemistries when she was an inpatient at [facility]  approximately three months ago.  She had a high white count at that time and  there was some questionable thickening of the distal ileum.  The patient  improved but then was seen in the emergency room about a month ago with  abdominal pain and bloody diarrhea, at which time sigmoidoscopic evaluation  to 60 cm by Petra Kuba, M.D., showed a rare mucosal ulceration and linear  ulceration.  Biopsies showed active colitis, raising the question of  ischemia.   At this time, the patient is asymptomatic except for some occasional small-  volume hematochezia that sounds compatible with hemorrhoidal bleeding.   FINDINGS:  Mild sigmoid diverticulosis and mild internal hemorrhoids.   DESCRIPTION OF PROCEDURE:  The patient provided written consent for the  procedure.  Sedation was fentanyl 75 mcg and Versed 4 mg IV without  arrhythmias or desaturation.  The Olympus adjustable-tension pediatric video  colonoscope was advanced through a slightly fixed and angulated sigmoid  region without much difficulty, all the way to the terminal ileum, which was  entered for a distance of perhaps 5 or 10 cm, after which pullback was  performed.  The TI had a normal appearance.  No biopsies were obtained  either in it or in the colon.   The quality of the prep was excellent, and it  is felt that all areas were  well-seen.   There was some mild sigmoid diverticulosis, but this was otherwise a normal  exam.  Specifically, there was no evidence of chronic colitis such as  erythema, ulcerations, friability, etc.  No polyps or masses were observed.  Retroflexion in the rectum as well as reinspection in the rectum was  unremarkable.  The patient tolerated the procedure well, and there were no  apparent complications.   IMPRESSION:  1. Mild sigmoid diverticulosis.  2. Resolution of mild inflammatory changes observed a month ago when she had     a clinical picture suggestive of mild or resolving ischemic colitis both     clinically and sigmoidoscopically.  3.     The patient did have some internal hemorrhoids seen during retroflexion and      pull-out through the anal canal, which probably account for her current  intermittent small-volume hematochezia.   PLAN:  Reassurance.  No specific follow-up needed.  Flexible sigmoidoscopy  for screening purposes in five years.                                               Bernette Redbird, M.D.    RB/MEDQ  D:  11/09/2002  T:  11/09/2002  Job:  [id number]   cc:   Stacie Acres. White, M.D.  [address]  [place]  [place] [address]  Fax: [phone]   Sheppard Plumber. Earlene Plater, M.D.  [address] [place]  [place] [address]  Fax: [phone]

## 2011-01-15 NOTE — H&P (Signed)
NAME:  [name], [name]                        ACCOUNT NO.:  [id number]   MEDICAL RECORD NO.:  [id number]                   PATIENT TYPE:  EMS   LOCATION:  ED                                   FACILITY:  [facility]   PHYSICIAN:  Deirdre Peer. Polite, M.D.              DATE OF BIRTH:  12-26-1950   DATE OF ADMISSION:  07/03/2002  DATE OF DISCHARGE:                                HISTORY & PHYSICAL   HISTORY OF PRESENT ILLNESS:  The patient is a 60 year old white female with  no significant past medical history comes to the ED now with abdominal pain,  distention since Sunday.  She denies any fever but some occasional chills.  She does admit to some nausea and emesis x 1 today after drinking contrast  to prepare for a CT scan.  The patient denies symptoms in the past of  dysuria, hematuria, but does admit to feeling gaseous and said that she gets  relief after defecation.  She also states that her last BMs have been  normal, without blood.  She denies any recent antibiotics.  New medications  include Advil and Darvocet, which she has been taking for pain.  In the ED  the patient was evaluated and found to have abnormalities in LFTs and an  abnormal CT scan showing partial small-bowel obstruction secondary to a  localized segment of thick-walled distal ileum.  There is no free air,  moderate ascites.  Also shows some sigmoid tics but no diverticulitis.  Admission was deemed necessary for further evaluation and treatment.   PAST MEDICAL HISTORY:  The patient denies diabetes, MI, CVA.  Denies lung  disease.   MEDICATIONS:  Advil once a week for pain.  Darvocet q.d.  No aspirin.  Occasionally takes B12; however, she has no documented deficiency.   SOCIAL HISTORY:  Negative for tobacco, alcohol, or drugs.  The patient works  part-time at [employer].   PAST SURGICAL HISTORY:  1. Significant for right shoulder surgery secondary to bone spur on     May 15, 2002.  2. Bilateral breast  implants in 1979.  3. Bilateral implants removal approximately nine years ago secondary to     leakage of the implants.  4. Hysterectomy in 1985, secondary to fibroids.  5. Tubal ligation in 1980.  6. Bone spurs removed from the fifth digit bilateral.  7. Breast biopsy/lumpectomy of the right breast in the past which was     benign.   ALLERGIES:  The patient denies any drug allergies but does admit to some  intolerance to MORPHINE.   FAMILY HISTORY:  Mother is deceased secondary to CA of the uterus.  Father  deceased secondary to CA of the lung.  She has four brothers.  One brother  had CA.  She has five sisters, three of whom are deceased; one from colitis,  one from heart problems.  Third sister, unaware of why her sister  is  deceased.   REVIEW OF SYSTEMS:  The patient has occasional dizziness when she has bouts  of vertigo.  She denies any headache.  No chest pain, no shortness of  breath.  No blood in stool, no blood in urine.  States she has gained 20  pounds in approximately 10 months.  Denied any night sweats.  No swollen  glands.  The patient does drink well water, but there are no other sick  contacts at home.  No recent travel.  No recent antibiotics.   PHYSICAL EXAMINATION:  GENERAL:  The patient is in mild distress secondary  to abdominal pain.  VITAL SIGNS:  BP 107/62, pulse 120, respiratory rate 20, temperature 98.1.  HEENT:  Pupils are equal, round, and reactive to light.  Anicteric.  There  are no oral lesions.  NECK:  Supple.  No adenopathy.  CHEST:  Clear to auscultation bilaterally.  CARDIOVASCULAR:  Regular S1, S2.  No S3.  ABDOMEN:  Distended.  There is no mass.  There is positive tympany.  There  is no rebound tenderness.  EXTREMITIES:  No edema.  The patient does have right shoulder pain with  movement which is secondary to prior surgery.  RECTAL:  Negative for mass.  Heme-negative.  NEUROLOGIC:  Nonfocal.   LABORATORY DATA:  CT scan of the abdomen is  described in HPI.   TSH 1.8.  BMET essentially normal.  CBC:  White count 13.6, hemoglobin 14.9,  platelets 340, neutrophils 72.  UA done in the office negative except for  mild ketones.  AST and ALT 212 and 254 respectively.  Amylase 34, calcium  10.7, bilirubin 0.5, alkaline phosphatase 208.   ASSESSMENT AND PLAN:  1. Abdominal pain, swelling in a patient with elevated white blood cells and     abnormal CT with localized segment of thick-walled ileum and ascites.     Differential diagnoses include infectious process versus malignancy with     ascites versus inflammatory bowel disease.  Doubtful that this is related     to adhesions.  The patient will be started on empiric antibiotics.  Will     obtain a surgical evaluation.  Check abdominal flat plate in the a.m.,     and check follow-up laboratories in the a.m.  2. Elevated liver function tests:  Most likely secondary to localized     inflammatory process versus other viral cause such as hepatitis.  Will     obtain a baseline hepatitis panel.  Further studies pending results of     follow-up laboratories.  3. Abdominal ascites:  This is most likely secondary to the localized     infection versus other pathological process.  However, the CT scan is     negative for any adenopathy, no mass or any other occult abscess.  We     will consider tapping the fluid if no improvement with the above     treatments with antibiotics.  Of note, there is no history of alcohol     abuse, and the CT scan does not show any signs of liver disease, i.e.,     cirrhosis, as cause for the patient's ascites.  CT has been reviewed with     the radiologist.  Deirdre Peer. Polite, M.D.    RDP/MEDQ  D:  07/04/2002  T:  07/04/2002  Job:  [id number]

## 2011-01-15 NOTE — Consult Note (Signed)
[name], [name]                          ACCOUNT NO.:  [id number]   MEDICAL RECORD NO.:  [id number]                    PATIENT TYPE:   LOCATION:                                       FACILITY:   PHYSICIAN:  [name], M.D.              DATE OF BIRTH:   DATE OF CONSULTATION:  DATE OF DISCHARGE:                                   CONSULTATION   HISTORY OF PRESENT ILLNESS:  The patient is a 60 year old Caucasian female  in the emergency room that I have been asked to see by Dr. Nehemiah [name].   PRIMARY CARE PHYSICIAN:  [name], M.D.   HISTORY OF PRESENT ILLNESS:  The patient is here because of abdominal pain  and distention. The patient, her husband, and her friend is in the room  during the examination. She relates that today, being Tuesday evening at  9:00 p.m., that on Sunday she began to feel unusual with the abdomen which  became bloated, distended and there was pain. The patient had breakfast on  Sunday. She had a steak mid day Sunday and a salad. She had a normal bowel  movement Sunday morning and later in the day, had loose stools. The  abdominal distention was variable during the day. She slept some on Sunday  night. On Monday morning she had a normal bowel movement. The pain was worse  and the abdominal distention was worse. She felt bloated and gaseous. She  had loose stools also later in the day on Sunday. She was able to work her  normal 6:00 a.m. until 11:00 a.m. shift at [employer] on Monday. The rest of  her day was not a good day. She did not sleep Monday night. She took an  enema on Tuesday morning with minimal results. Usually, she has bowel  movements on a regular basis. She does not use softeners, laxatives. Bowel  habits have never been a problem. She denies GU symptoms at this time. She  has never had an episode like this before. She was seen by an assistant at  Dr. Lucilla Lame office who sent her to the [facility]  emergency room where  she has been seen and evaluated by Dr. Nehemiah [name].   PAST MEDICAL HISTORY:  Is well documented by Dr. Nehemiah [name] but briefly, it  includes tubal ligation, abdominal hysterectomy, mammary implants (cosmetic)  many years ago. The mammary implants were removed nine years ago, suspect  leaking. Remote breast biopsy. Recent right shoulder surgery for bone spurs.   ALLERGIES:  No known drug allergies.   MEDICATIONS:  The patient takes no prescription drugs on a regular basis.  She has had some Advil recently and took Darvocet after her shoulder  surgery.   SOCIAL HISTORY:  The patient has not had any unusual exposures. She  completely and consistently denies any alcoholic beverages as does her  husband and her friend.  PHYSICAL EXAMINATION:  GENERAL: An ill appearing Caucasian female. No  jaundice.  SKIN: Warm to touch.  VITAL SIGNS: Temperature 99.  HEENT: Unremarkable for gross abnormalities.  CHEST: Clear to auscultation and percussion.  LUNGS: Equal breath sounds bilaterally.  CARDIAC: Heart rhythm is rapid at 100 and regular. No murmur, rub, or  gallop. Peripheral pulses are intact.  BREAST: Negative to palpation.  ABDOMEN: Rotund. Appears distended. It is however soft. Bowel sounds are  absent. There is a long horizontal scar in the Pfannenstiel position in the  lower abdomen. There is a smaller scar to the left of the midline, felt to  be tubal ligation scar. I do not detect evidence of ascites or free fluid.  There is minimal discomfort in the mid abdomen with palpation or percussion.  There are no masses. Liver is not palpable. Spleen not palpable. No hernias  are noted.   IMPRESSION:  I have reviewed the patient's chart, Dr. Idelle [name] notes, lab  data, and have again reviewed the CT scan with the radiologist. Her clinical  picture is a bit puzzling in that she has a clinical history compatible with  partial small bowel obstruction. However, the CT scan does show some  proximal  dilated small bowel loops. There is one segment of mid ileum that  is thickened. There is also free fluid in the pelvis around the liver. The  liver, gallbladder, and spleen appear normal on CT. Clinically, it is to be  noted that the patient passed the pink contrast taken P.O. for the CT scan  through the stool within an hour. Also, the elevated liver function studies,  low grade temperature, and malaise are reminiscent of hepatitis. Therefore,  I agree with Dr. Idelle [name] plan to repeat studies, complete the chemical  profiles and repeat the flat and upright of the abdomen in the morning. I  discussed this with the patient has her husband and they seem satisfied and  agree with our plan of attack.                                               [name], M.D.    TED/MEDQ  D:  07/03/2002  T:  07/03/2002  Job:  [id number]   cc:   [name], M.D.   [name], M.D.  [address] [place]  [place] [address]  Fax: [phone]

## 2013-11-20 ENCOUNTER — Other Ambulatory Visit: Payer: Self-pay | Admitting: Podiatry

## 2013-11-20 ENCOUNTER — Ambulatory Visit (INDEPENDENT_AMBULATORY_CARE_PROVIDER_SITE_OTHER): Payer: BC Managed Care – PPO

## 2013-11-20 ENCOUNTER — Encounter: Payer: Self-pay | Admitting: Podiatry

## 2013-11-20 ENCOUNTER — Ambulatory Visit (INDEPENDENT_AMBULATORY_CARE_PROVIDER_SITE_OTHER): Payer: BC Managed Care – PPO | Admitting: Podiatry

## 2013-11-20 VITALS — BP 127/80 | HR 84 | Resp 12

## 2013-11-20 DIAGNOSIS — R52 Pain, unspecified: Secondary | ICD-10-CM

## 2013-11-20 DIAGNOSIS — M722 Plantar fascial fibromatosis: Secondary | ICD-10-CM

## 2013-11-20 DIAGNOSIS — M6740 Ganglion, unspecified site: Secondary | ICD-10-CM

## 2013-11-20 MED ORDER — TRIAMCINOLONE ACETONIDE 10 MG/ML IJ SUSP
10.0000 mg | Freq: Once | INTRAMUSCULAR | Status: AC
  Administered 2013-11-20: 10 mg

## 2013-11-20 NOTE — Patient Instructions (Signed)

## 2013-11-20 NOTE — Progress Notes (Signed)
   Subjective:    Patient ID: [name], female    DOB: [birth year], 63 y.o.   MRN: [id number]  HPI PT STATED LT TOP OF THE FOOT HAVE A KNOT AND IS BEEN HURTING FOR 2 MONTHS. THE FOOT IS GETTING WORSE. THE FOOT GET AGGRAVATED BY PUTTING PRESSURE AND TRIED NO TREATMENT. ALSO RT FOOT HEEL IS HURTING FOR 5 MONTHS. THE FOOT IS GETTING WORSE. THE FOOT GET AGGRAVATED WHEN SIT AND STAND BACK UP AND TRIED NO TREATMENT.    Review of Systems  Constitutional: Positive for unexpected weight change.  HENT: Positive for hearing loss.   Eyes: Positive for pain and itching.  Gastrointestinal: Positive for diarrhea.  Endocrine: Positive for cold intolerance.       Objective:   Physical Exam        Assessment & Plan:

## 2013-11-21 NOTE — Progress Notes (Signed)
Subjective:     Patient ID: [name], female   DOB: Aug 18, 1951, 63 y.o.   MRN: [id number]  Foot Pain   patient states she has and not on top of her left foot which has been bothering her the last couple months and her heel on her right foot has been hurting her for 4-6 months and is causing her pain with ambulation and after sitting. She's tried soaks and she's tried shoe modifications without relief   Review of Systems  All other systems reviewed and are negative.       Objective:   Physical Exam  Nursing note and vitals reviewed. Constitutional: She is oriented to person, place, and time.  Cardiovascular: Intact distal pulses.   Musculoskeletal: Normal range of motion.  Neurological: She is oriented to person, place, and time.  Skin: Skin is warm.   neurovascular status intact with muscle strength adequate and range of motion within normal limits. Patient is found to have a high arched foot with a relatively narrow heel and pain at the plantar fascial insertion right. On the left I noted a small nodule on the lateral side of the dorsal foot measuring approximately 5 x 5 mm within subcutaneous tissue that is painful when pressed. Fill time to the toes is normal and no other pathology noted     Assessment:     Plantar fasciitis of the right heel with inflammation and probable nodule which may be a small ganglionic cyst or possible hemangioma or other unknown soft tissue lesion    Plan:     H&P and x-rays reviewed. Due to the long-term nature and this chronic structural foot condition she has I have recommended long-term orthotics and scanned for custom orthotics. I injected the right plantar fascia 3 mg Kenalog 5 mg Xylocaine Marcaine mixture and for the left I did a proximal nerve block I aspirated the lesion I was able to get out a small amount of gelatinous material which I expressed using palpation and then applied sterile dressing. Reappoint to recheck again in 2 weeks  when orthotics are ready and of the lesion remained symptomatic and painful we may need to consider removal

## 2013-12-04 ENCOUNTER — Ambulatory Visit: Payer: BC Managed Care – PPO | Admitting: Podiatry

## 2013-12-07 ENCOUNTER — Ambulatory Visit (INDEPENDENT_AMBULATORY_CARE_PROVIDER_SITE_OTHER): Payer: BC Managed Care – PPO | Admitting: Podiatry

## 2013-12-07 VITALS — Resp 16 | Ht 63.0 in | Wt 163.0 lb

## 2013-12-07 DIAGNOSIS — M722 Plantar fascial fibromatosis: Secondary | ICD-10-CM

## 2013-12-07 NOTE — Patient Instructions (Signed)

## 2013-12-07 NOTE — Progress Notes (Signed)
Picking up orthotics and went over wearing instructions. 

## 2014-04-02 ENCOUNTER — Ambulatory Visit: Payer: BC Managed Care – PPO | Admitting: Podiatry

## 2014-07-12 ENCOUNTER — Ambulatory Visit (INDEPENDENT_AMBULATORY_CARE_PROVIDER_SITE_OTHER): Payer: Medicare Other | Admitting: Podiatry

## 2014-07-12 VITALS — BP 104/56 | HR 87 | Resp 16

## 2014-07-12 DIAGNOSIS — M722 Plantar fascial fibromatosis: Secondary | ICD-10-CM

## 2014-07-12 MED ORDER — TRIAMCINOLONE ACETONIDE 10 MG/ML IJ SUSP
10.0000 mg | Freq: Once | INTRAMUSCULAR | Status: AC
  Administered 2014-07-12: 10 mg

## 2014-07-12 NOTE — Progress Notes (Signed)
Subjective:     Patient ID: [name], female   DOB: [birth year], 63 y.o.   MRN: [id number]  HPIpatient presents stating I need an injection in my right heel as it has been so sore and also I am having trouble getting up in the morning or after sitting due to the intense discomfort   Review of Systems     Objective:   Physical Exam Neurovascular status intact with extreme discomfort plantar aspect right heel at the insertional point of the tendon into the calcaneus with inflammation and fluid buildup noted patient also has a moderate diminishment of the fat pad    Assessment:     Plantar fasciitis right with inflammatory condition and reduced fat pad thickness    Plan:     I explained condition and we did one more very careful injection at the time with 2 mg dexamethasone Kenalog 5 mg Xylocaine and I then applied night splint with instructions on sleeping with it and then nighttime usage. Reappoint to recheck again in 3 weeks for evaluation

## 2014-08-02 ENCOUNTER — Ambulatory Visit (INDEPENDENT_AMBULATORY_CARE_PROVIDER_SITE_OTHER): Payer: Medicare Other | Admitting: Podiatry

## 2014-08-02 VITALS — BP 110/60 | HR 88 | Resp 16

## 2014-08-02 DIAGNOSIS — M722 Plantar fascial fibromatosis: Secondary | ICD-10-CM

## 2014-08-04 NOTE — Progress Notes (Signed)
Subjective:     Patient ID: [name], female   DOB: April 16, 1951, 63 y.o.   MRN: [id number]  HPI patient continues to experience severe discomfort in the right plantar heel despite numerous conservative care that we have rendered   Review of Systems     Objective:   Physical Exam Neurovascular status unchanged with significant discomfort medial fascial band right at the insertion of the tendon into the calcaneus    Assessment:     Plantar fasciitis right inflammation at the insertion of the tendon into the calcaneus    Plan:     Reviewed physical therapy and at this time discussed different aggressive treatment options. She has opted for shockwave therapy along with immobilization and today walking boot was dispensed with instructions on usage and next week we will commence the utilization of shockwave therapy

## 2014-08-06 ENCOUNTER — Telehealth: Payer: Self-pay | Admitting: Podiatry

## 2014-08-06 NOTE — Telephone Encounter (Signed)
Patient called and cancelled EPAT appt.Patient did not want to pay for EPAT with Christmas coming up. Patient said she would call back to set up an appt.

## 2014-08-09 ENCOUNTER — Ambulatory Visit: Payer: Medicare Other | Admitting: Podiatry

## 2014-09-12 ENCOUNTER — Encounter: Payer: Self-pay | Admitting: Podiatry

## 2014-09-12 ENCOUNTER — Ambulatory Visit (INDEPENDENT_AMBULATORY_CARE_PROVIDER_SITE_OTHER): Payer: PPO | Admitting: Podiatry

## 2014-09-12 VITALS — BP 127/71 | HR 95 | Resp 16

## 2014-09-12 DIAGNOSIS — M722 Plantar fascial fibromatosis: Secondary | ICD-10-CM | POA: Diagnosis not present

## 2014-09-12 NOTE — Patient Instructions (Signed)
Pre-Operative Instructions  Congratulations, you have decided to take an important step to improving your quality of life.  You can be assured that the doctors of [facility] will be with you every step of the way.  1. Plan to be at the surgery center/hospital at least 1 (one) hour prior to your scheduled time unless otherwise directed by the surgical center/hospital staff.  You must have a responsible adult accompany you, remain during the surgery and drive you home.  Make sure you have directions to the surgical center/hospital and know how to get there on time. 2. For hospital based surgery you will need to obtain a history and physical form from your family physician within 1 month prior to the date of surgery- we will give you a form for you primary physician.  3. We make every effort to accommodate the date you request for surgery.  There are however, times where surgery dates or times have to be moved.  We will contact you as soon as possible if a change in schedule is required.   4. No Aspirin/Ibuprofen for one week before surgery.  If you are on aspirin, any non-steroidal anti-inflammatory medications (Mobic, Aleve, Ibuprofen) you should stop taking it 7 days prior to your surgery.  You make take Tylenol  For pain prior to surgery.  5. Medications- If you are taking daily heart and blood pressure medications, seizure, reflux, allergy, asthma, anxiety, pain or diabetes medications, make sure the surgery center/hospital is aware before the day of surgery so they may notify you which medications to take or avoid the day of surgery. 6. No food or drink after midnight the night before surgery unless directed otherwise by surgical center/hospital staff. 7. No alcoholic beverages 24 hours prior to surgery.  No smoking 24 hours prior to or 24 hours after surgery. 8. Wear loose pants or shorts- loose enough to fit over bandages, boots, and casts. 9. No slip on shoes, sneakers are best. 10. Bring  your boot with you to the surgery center/hospital.  Also bring crutches or a walker if your physician has prescribed it for you.  If you do not have this equipment, it will be provided for you after surgery. 11. If you have not been contracted by the surgery center/hospital by the day before your surgery, call to confirm the date and time of your surgery. 12. Leave-time from work may vary depending on the type of surgery you have.  Appropriate arrangements should be made prior to surgery with your employer. 13. Prescriptions will be provided immediately following surgery by your doctor.  Have these filled as soon as possible after surgery and take the medication as directed. 14. Remove nail polish on the operative foot. 15. Wash the night before surgery.  The night before surgery wash the foot and leg well with the antibacterial soap provided and water paying special attention to beneath the toenails and in between the toes.  Rinse thoroughly with water and dry well with a towel.  Perform this wash unless told not to do so by your physician.  Enclosed: 1 Ice pack (please put in freezer the night before surgery)   1 Hibiclens skin cleaner   Pre-op Instructions  If you have any questions regarding the instructions, do not hesitate to call our office.  [place]: [address] [place], [place] [address] [phone]  [place]: [address] [place], [place] [address] [phone]  [place]: [address]  [place],  [address] [phone]  Dr. Richard   Tuchman DPM, Dr. Norman Regal DPM Dr. Richard Sikora DPM, Dr. M. Todd Hyatt DPM, Dr. Kathryn Egerton DPM 

## 2014-09-18 ENCOUNTER — Telehealth: Payer: Self-pay | Admitting: *Deleted

## 2014-09-18 NOTE — Telephone Encounter (Signed)
I called the patient to see if we could schedule her surgery on 10/01/2014 instead of 09/25/2014.  "That will be fine.  What time will it be?"  It will be sometime that morning.  The surgical center will call you with the exact time to arrive 1-2 days prior to date.  "Where do I go?  I didn't receive any information about that."  I will put a surgical pamphlet in the mail for you.  [place] pamphlet was put in outgoing mail.

## 2014-09-26 ENCOUNTER — Telehealth: Payer: Self-pay | Admitting: *Deleted

## 2014-09-26 NOTE — Telephone Encounter (Signed)
Pt states she didn't not get the surgical scrub for her surgery 10/01/2014.  I told the pt she could pick it up in [place] on Monday.  Pt agreed.

## 2014-10-01 ENCOUNTER — Encounter: Payer: Self-pay | Admitting: Podiatry

## 2014-10-01 DIAGNOSIS — M722 Plantar fascial fibromatosis: Secondary | ICD-10-CM

## 2014-10-07 ENCOUNTER — Encounter: Payer: PPO | Admitting: Podiatry

## 2014-10-08 ENCOUNTER — Ambulatory Visit (INDEPENDENT_AMBULATORY_CARE_PROVIDER_SITE_OTHER): Payer: PPO | Admitting: Podiatry

## 2014-10-08 ENCOUNTER — Encounter: Payer: Self-pay | Admitting: Podiatry

## 2014-10-08 VITALS — BP 128/69 | HR 127 | Resp 16

## 2014-10-08 DIAGNOSIS — M722 Plantar fascial fibromatosis: Secondary | ICD-10-CM

## 2014-10-08 NOTE — Progress Notes (Signed)
Subjective:     Patient ID: [name], female   DOB: [birth year], 64 y.o.   MRN: [id number]  HPI patient presents stating her heel is doing real well and she's walking without pain or swelling   Review of Systems     Objective:   Physical Exam One week after endoscopic fasciotomy right with wound edges well coapted negative Homan and minimal edema noted    Assessment:     Doing well post endoscopic surgery right heel    Plan:     Reapplied sterile dressing instructed on continued immobilization and reappoint 2 weeks for suture removal or earlier if necessary

## 2014-10-14 NOTE — Progress Notes (Signed)
DOS Right Endoscopic plantar fasciotomy.

## 2014-10-25 ENCOUNTER — Ambulatory Visit (INDEPENDENT_AMBULATORY_CARE_PROVIDER_SITE_OTHER): Payer: PPO | Admitting: Podiatry

## 2014-10-25 DIAGNOSIS — M722 Plantar fascial fibromatosis: Secondary | ICD-10-CM

## 2014-10-25 NOTE — Progress Notes (Signed)
Subjective:     Patient ID: [name], female   DOB: 03/21/1951, 64 y.o.   MRN: [id number]  HPI patient presents with well-healing surgical foot right stating she's having no pain with stitches intact medial and lateral heel   Review of Systems     Objective:   Physical Exam Neurovascular status intact with wound edges well coapted medial lateral heel right with stitches intact    Assessment:     Doing well post endoscopic fasciotomy right with negative Homans sign noted    Plan:     Stitches removed wound edges coapted well and sterile dressings applied. Dispensed anklet and instructed on gradual increase in activity and to reappoint if any symptoms should occur

## 2014-10-25 NOTE — Progress Notes (Signed)
All sutures removed today.

## 2014-10-31 ENCOUNTER — Encounter (HOSPITAL_COMMUNITY): Payer: Self-pay | Admitting: Emergency Medicine

## 2014-10-31 ENCOUNTER — Emergency Department (HOSPITAL_COMMUNITY)
Admission: EM | Admit: 2014-10-31 | Discharge: 2014-10-31 | Disposition: A | Discharge status: Home / Self Care | Payer: PPO | Attending: Emergency Medicine | Admitting: Emergency Medicine

## 2014-10-31 ENCOUNTER — Emergency Department (HOSPITAL_COMMUNITY): Payer: PPO

## 2014-10-31 DIAGNOSIS — Z85828 Personal history of other malignant neoplasm of skin: Secondary | ICD-10-CM | POA: Insufficient documentation

## 2014-10-31 DIAGNOSIS — K5792 Diverticulitis of intestine, part unspecified, without perforation or abscess without bleeding: Secondary | ICD-10-CM | POA: Insufficient documentation

## 2014-10-31 DIAGNOSIS — Z79899 Other long term (current) drug therapy: Secondary | ICD-10-CM | POA: Diagnosis not present

## 2014-10-31 DIAGNOSIS — E559 Vitamin D deficiency, unspecified: Secondary | ICD-10-CM | POA: Diagnosis not present

## 2014-10-31 DIAGNOSIS — Z8659 Personal history of other mental and behavioral disorders: Secondary | ICD-10-CM | POA: Diagnosis not present

## 2014-10-31 DIAGNOSIS — M1990 Unspecified osteoarthritis, unspecified site: Secondary | ICD-10-CM | POA: Diagnosis not present

## 2014-10-31 DIAGNOSIS — Z8719 Personal history of other diseases of the digestive system: Secondary | ICD-10-CM | POA: Diagnosis not present

## 2014-10-31 DIAGNOSIS — R52 Pain, unspecified: Secondary | ICD-10-CM

## 2014-10-31 DIAGNOSIS — R1032 Left lower quadrant pain: Secondary | ICD-10-CM | POA: Diagnosis present

## 2014-10-31 LAB — CBC WITH DIFFERENTIAL/PLATELET
BASOS ABS: 0.1 10*3/uL (ref 0.0–0.1)
Basophils Relative: 1 % (ref 0–1)
EOS ABS: 0.3 10*3/uL (ref 0.0–0.7)
Eosinophils Relative: 3 % (ref 0–5)
HCT: 39.1 % (ref 36.0–46.0)
Hemoglobin: 13 g/dL (ref 12.0–15.0)
LYMPHS ABS: 2.3 10*3/uL (ref 0.7–4.0)
Lymphocytes Relative: 24 % (ref 12–46)
MCH: 28.5 pg (ref 26.0–34.0)
MCHC: 33.2 g/dL (ref 30.0–36.0)
MCV: 85.7 fL (ref 78.0–100.0)
Monocytes Absolute: 0.8 10*3/uL (ref 0.1–1.0)
Monocytes Relative: 9 % (ref 3–12)
NEUTROS PCT: 63 % (ref 43–77)
Neutro Abs: 6.2 10*3/uL (ref 1.7–7.7)
PLATELETS: 312 10*3/uL (ref 150–400)
RBC: 4.56 MIL/uL (ref 3.87–5.11)
RDW: 13.1 % (ref 11.5–15.5)
WBC: 9.6 10*3/uL (ref 4.0–10.5)

## 2014-10-31 LAB — LIPASE, BLOOD: Lipase: 21 U/L (ref 11–59)

## 2014-10-31 LAB — COMPREHENSIVE METABOLIC PANEL
ALBUMIN: 3.6 g/dL (ref 3.5–5.2)
ALT: 51 U/L — ABNORMAL HIGH (ref 0–35)
AST: 31 U/L (ref 0–37)
Alkaline Phosphatase: 126 U/L — ABNORMAL HIGH (ref 39–117)
Anion gap: 9 (ref 5–15)
BUN: 8 mg/dL (ref 6–23)
CALCIUM: 9.3 mg/dL (ref 8.4–10.5)
CO2: 26 mmol/L (ref 19–32)
Chloride: 104 mmol/L (ref 96–112)
Creatinine, Ser: 0.62 mg/dL (ref 0.50–1.10)
GFR calc Af Amer: >90 mL/min (ref >90)
GFR calc non Af Amer: >90 mL/min (ref >90)
GLUCOSE: 100 mg/dL — AB (ref 70–99)
Potassium: 3.6 mmol/L (ref 3.5–5.1)
SODIUM: 139 mmol/L (ref 135–145)
Total Bilirubin: 0.6 mg/dL (ref 0.3–1.2)
Total Protein: 6.2 g/dL (ref 6.0–8.3)

## 2014-10-31 LAB — URINE MICROSCOPIC-ADD ON

## 2014-10-31 LAB — URINALYSIS, ROUTINE W REFLEX MICROSCOPIC
Bilirubin Urine: NEGATIVE
GLUCOSE, UA: NEGATIVE mg/dL
Ketones, ur: NEGATIVE mg/dL
LEUKOCYTES UA: NEGATIVE
Nitrite: NEGATIVE
PH: 7 (ref 5.0–8.0)
PROTEIN: NEGATIVE mg/dL
SPECIFIC GRAVITY, URINE: 1.015 (ref 1.005–1.030)
Urobilinogen, UA: 0.2 mg/dL (ref 0.0–1.0)

## 2014-10-31 MED ORDER — METRONIDAZOLE 500 MG PO TABS: 500.0000 mg | ORAL_TABLET | Freq: Four times a day (QID) | ORAL | Status: DC

## 2014-10-31 MED ORDER — HYDROCODONE-ACETAMINOPHEN 5-325 MG PO TABS: 1.0000 | ORAL_TABLET | Freq: Four times a day (QID) | ORAL | Status: DC | PRN

## 2014-10-31 MED ORDER — ONDANSETRON 4 MG PO TBDP: ORAL_TABLET | ORAL | Status: DC

## 2014-10-31 MED ORDER — IOHEXOL 300 MG/ML  SOLN
100.0000 mL | Freq: Once | INTRAMUSCULAR | Status: AC | PRN
Start: 2014-10-31 — End: 2014-10-31
  Administered 2014-10-31: 100 mL via INTRAVENOUS

## 2014-10-31 MED ORDER — METRONIDAZOLE IN NACL 5-0.79 MG/ML-% IV SOLN
500.0000 mg | Freq: Once | INTRAVENOUS | Status: AC
  Administered 2014-10-31: 500 mg via INTRAVENOUS
  Filled 2014-10-31: qty 100

## 2014-10-31 MED ORDER — HYDROMORPHONE HCL 1 MG/ML IJ SOLN
1.0000 mg | Freq: Once | INTRAMUSCULAR | Status: AC
  Administered 2014-10-31: 1 mg via INTRAVENOUS
  Filled 2014-10-31: qty 1

## 2014-10-31 MED ORDER — CIPROFLOXACIN IN D5W 400 MG/200ML IV SOLN
400.0000 mg | Freq: Once | INTRAVENOUS | Status: AC
  Administered 2014-10-31: 400 mg via INTRAVENOUS
  Filled 2014-10-31: qty 200

## 2014-10-31 MED ORDER — CIPROFLOXACIN HCL 500 MG PO TABS: 500.0000 mg | ORAL_TABLET | Freq: Two times a day (BID) | ORAL | Status: DC

## 2014-10-31 MED ORDER — IOHEXOL 300 MG/ML  SOLN
25.0000 mL | INTRAMUSCULAR | Status: AC
  Administered 2014-10-31: 25 mL via ORAL

## 2014-10-31 MED ORDER — ONDANSETRON HCL 4 MG/2ML IJ SOLN
4.0000 mg | Freq: Once | INTRAMUSCULAR | Status: AC
  Administered 2014-10-31: 4 mg via INTRAVENOUS
  Filled 2014-10-31: qty 2

## 2014-10-31 NOTE — Discharge Instructions (Signed)
Follow up with your md next week. °

## 2014-10-31 NOTE — ED Notes (Signed)
Patient coming from home with bilateral lower abdominal pain that has been ongoing since last Thursday with worsening pain.  Patient states that pain goes from the bilateral lower back around to the lower abdomin.  No pain at this time, but the pain is intermittent and comes in waves.

## 2014-10-31 NOTE — ED Provider Notes (Signed)
CSN: [id number]     Arrival date & time 10/31/14  6381 History   First MD Initiated Contact with Patient 10/31/14 [phone]     Chief Complaint  Patient presents with  . Abdominal Pain     (Consider location/radiation/quality/duration/timing/severity/associated sxs/prior Treatment) Patient is a 64 y.o. female presenting with abdominal pain. The history is provided by the patient (the pt complains fo lower abd pain).  Abdominal Pain Pain location:  LLQ and RLQ Pain quality: aching   Pain radiates to:  Does not radiate Pain severity:  Moderate Onset quality:  Gradual Timing:  Constant Progression:  Worsening Chronicity:  New Context: not alcohol use   Associated symptoms: no chest pain, no cough, no diarrhea, no fatigue and no hematuria     Past Medical History  Diagnosis Date  . Depression   . Anxiety   . OA (osteoarthritis)     HANDS  . Allergic rhinitis   . Diverticulosis     HEMMORHOIDS ON COLONOSCOPY  . TMJ syndrome   . Vitamin D deficiency   . IBS (irritable bowel syndrome)     DIARRHEA PRONE  . Skin cancer, basal cell     HX skin cancer per patient   Past Surgical History  Procedure Laterality Date  . Bone spurs removal      TOES  . Tubal ligation    . Breast biopsy      RIGHT  . Breast enhancement surgery      THEN REMOVED  . Vesicovaginal fistula closure w/ tah      WITHOUT OOPHS  . Shoulder surgery      RIGHT  . Foot surgery      Right   Family History  Problem Relation Age of Onset  . Hypertension Mother   . Cancer - Other Mother   . Lung cancer Father   . Cancer - Other Brother   . CVA Brother   . Heart attack Brother   . AAA (abdominal aortic aneurysm) Sister   . Depression Sister    History  Substance Use Topics  . Smoking status: Never Smoker   . Smokeless tobacco: Not on file  . Alcohol Use: No   OB History    No data available     Review of Systems  Constitutional: Negative for appetite change and fatigue.  HENT: Negative for  congestion, ear discharge and sinus pressure.   Eyes: Negative for discharge.  Respiratory: Negative for cough.   Cardiovascular: Negative for chest pain.  Gastrointestinal: Positive for abdominal pain. Negative for diarrhea.  Genitourinary: Negative for frequency and hematuria.  Musculoskeletal: Negative for back pain.  Skin: Negative for rash.  Neurological: Negative for seizures and headaches.  Psychiatric/Behavioral: Negative for hallucinations.      Allergies  Ketorolac tromethamine; Morphine and related; Percocet; and Zoloft  Home Medications   Prior to Admission medications   Medication Sig Start Date End Date Taking? Authorizing Provider  Cholecalciferol (VITAMIN D) 2000 UNITS CAPS Take 2,000 Units by mouth daily.   Yes Historical Provider, MD  ibuprofen (ADVIL,MOTRIN) 200 MG tablet Take 600-800 mg by mouth every 6 (six) hours as needed for moderate pain.    Yes Historical Provider, MD  ciprofloxacin (CIPRO) 500 MG tablet Take 1 tablet (500 mg total) by mouth 2 (two) times daily. One po bid x 7 days 10/31/14   Maudry Diego, MD  HYDROcodone-acetaminophen (NORCO/VICODIN) 5-325 MG per tablet Take 1 tablet by mouth every 6 (six) hours as needed for moderate  pain. 10/31/14   Maudry Diego, MD  metroNIDAZOLE (FLAGYL) 500 MG tablet Take 1 tablet (500 mg total) by mouth 4 (four) times daily. One po bid x 7 days 10/31/14   Maudry Diego, MD  ondansetron (ZOFRAN ODT) 4 MG disintegrating tablet 4mg  ODT q4 hours prn nausea/vomit 10/31/14   Maudry Diego, MD  Vitamin D, Ergocalciferol, (DRISDOL) 50000 UNITS CAPS capsule Take 50,000 Units by mouth every 7 (seven) days.    Historical Provider, MD   BP 120/54 mmHg  Pulse 91  Temp(Src) 98.8 F (37.1 C) (Oral)  Resp 22  Ht 5\' 4"  (1.626 m)  Wt 175 lb (79.379 kg)  BMI 30.02 kg/m2  SpO2 99% Physical Exam  Constitutional: She is oriented to person, place, and time. She appears well-developed.  HENT:  Head: Normocephalic.  Eyes:  Conjunctivae and EOM are normal. No scleral icterus.  Neck: Neck supple. No thyromegaly present.  Cardiovascular: Normal rate and regular rhythm.  Exam reveals no gallop and no friction rub.   No murmur heard. Pulmonary/Chest: No stridor. She has no wheezes. She has no rales. She exhibits no tenderness.  Abdominal: She exhibits no distension. There is tenderness. There is no rebound.  Musculoskeletal: Normal range of motion. She exhibits no edema.  Lymphadenopathy:    She has no cervical adenopathy.  Neurological: She is oriented to person, place, and time. She exhibits normal muscle tone. Coordination normal.  Skin: No rash noted. No erythema.  Psychiatric: She has a normal mood and affect. Her behavior is normal.    ED Course  Procedures (including critical care time) Labs Review Labs Reviewed  COMPREHENSIVE METABOLIC PANEL - Abnormal; Notable for the following:    Glucose, Bld 100 (*)    ALT 51 (*)    Alkaline Phosphatase 126 (*)    All other components within normal limits  URINALYSIS, ROUTINE W REFLEX MICROSCOPIC - Abnormal; Notable for the following:    Hgb urine dipstick SMALL (*)    All other components within normal limits  CBC WITH DIFFERENTIAL/PLATELET  LIPASE, BLOOD  URINE MICROSCOPIC-ADD ON    Imaging Review Ct Abdomen Pelvis W Contrast  10/31/2014   CLINICAL DATA:  Bilateral lower abdominal pain starting last Thursday with worsening pain  EXAM: CT ABDOMEN AND PELVIS WITH CONTRAST  TECHNIQUE: Multidetector CT imaging of the abdomen and pelvis was performed using the standard protocol following bolus administration of intravenous contrast.  CONTRAST:  171mL OMNIPAQUE IOHEXOL 300 MG/ML  SOLN  COMPARISON:  10/20/2010  FINDINGS: Sagittal images of the spine shows mild degenerative changes thoracolumbar spine.  Lung bases are unremarkable.  Enhanced liver shows no focal mass. Mild distended gallbladder without evidence of calcified gallstones. Enhanced pancreas, spleen and  adrenal glands are unremarkable. Enhanced kidneys are symmetrical in size. No hydronephrosis or hydroureter.  No aortic aneurysm.  Delayed renal images shows bilateral renal symmetrical excretion. Bilateral visualized proximal ureter is unremarkable.  No small bowel obstruction.  No ascites or free air.  No adenopathy.  There is no pericecal inflammation. The terminal ileum is unremarkable. Normal appendix partially visualized in coronal image 50.  Descending colon diverticula are noted. No evidence of acute diverticulitis. The patient is status post hysterectomy. Multiple sigmoid colon diverticula are noted. In axial image 78 there is mild thickening of colonic wall mid sigmoid colon in left pelvis with mild stranding of pericolonic fat. A inflamed colonic diverticulum is noted in axial image 75. Findings are consistent with acute diverticulitis or focal colitis. There  is no diverticular abscess. No pericolonic abscess. The urinary bladder is unremarkable.  IMPRESSION: 1. Multiple sigmoid colon diverticula are noted. There is short segment thickening of mid sigmoid colon wall and stranding of pericolonic fat. Mild inflamed colonic diverticulum is noted at this level in axial image 75. Findings are consistent with acute diverticulitis or focal colitis. There is no evidence of diverticular or pericolonic abscess. 2. Status post hysterectomy. There are descending colon diverticula. No evidence of acute diverticulitis. 3. Normal appendix.  No pericecal inflammation. 4. No hydronephrosis or hydroureter. Bilateral renal symmetrical excretion. 5. Degenerative changes thoracolumbar spine.   Electronically Signed   By: Lahoma Crocker M.D.   On: 10/31/2014 10:01     EKG Interpretation None      MDM   Final diagnoses:  Pain  Acute diverticulitis    Diverticulitis,   tx with flagyl, cipro, vicodin, zofran and follow up with pcp next week    Maudry Diego, MD 10/31/14 1029

## 2014-11-02 ENCOUNTER — Emergency Department (HOSPITAL_COMMUNITY): Payer: PPO

## 2014-11-02 ENCOUNTER — Emergency Department (HOSPITAL_COMMUNITY)
Admission: EM | Admit: 2014-11-02 | Discharge: 2014-11-02 | Disposition: A | Discharge status: Home / Self Care | Payer: PPO | Attending: Emergency Medicine | Admitting: Emergency Medicine

## 2014-11-02 ENCOUNTER — Encounter (HOSPITAL_COMMUNITY): Payer: Self-pay | Admitting: Emergency Medicine

## 2014-11-02 DIAGNOSIS — E559 Vitamin D deficiency, unspecified: Secondary | ICD-10-CM | POA: Diagnosis not present

## 2014-11-02 DIAGNOSIS — Z8659 Personal history of other mental and behavioral disorders: Secondary | ICD-10-CM | POA: Diagnosis not present

## 2014-11-02 DIAGNOSIS — M1990 Unspecified osteoarthritis, unspecified site: Secondary | ICD-10-CM | POA: Diagnosis not present

## 2014-11-02 DIAGNOSIS — Z85828 Personal history of other malignant neoplasm of skin: Secondary | ICD-10-CM | POA: Insufficient documentation

## 2014-11-02 DIAGNOSIS — M549 Dorsalgia, unspecified: Secondary | ICD-10-CM | POA: Insufficient documentation

## 2014-11-02 DIAGNOSIS — Z8719 Personal history of other diseases of the digestive system: Secondary | ICD-10-CM | POA: Insufficient documentation

## 2014-11-02 DIAGNOSIS — R1030 Lower abdominal pain, unspecified: Secondary | ICD-10-CM | POA: Diagnosis not present

## 2014-11-02 DIAGNOSIS — Z792 Long term (current) use of antibiotics: Secondary | ICD-10-CM | POA: Insufficient documentation

## 2014-11-02 DIAGNOSIS — R109 Unspecified abdominal pain: Secondary | ICD-10-CM

## 2014-11-02 DIAGNOSIS — M5489 Other dorsalgia: Secondary | ICD-10-CM

## 2014-11-02 DIAGNOSIS — Z79899 Other long term (current) drug therapy: Secondary | ICD-10-CM | POA: Diagnosis not present

## 2014-11-02 LAB — COMPREHENSIVE METABOLIC PANEL
ALBUMIN: 3.7 g/dL (ref 3.5–5.2)
ALK PHOS: 114 U/L (ref 39–117)
ALT: 39 U/L — ABNORMAL HIGH (ref 0–35)
ANION GAP: 8 (ref 5–15)
AST: 24 U/L (ref 0–37)
BUN: 7 mg/dL (ref 6–23)
CO2: 25 mmol/L (ref 19–32)
Calcium: 9.3 mg/dL (ref 8.4–10.5)
Chloride: 106 mmol/L (ref 96–112)
Creatinine, Ser: 0.57 mg/dL (ref 0.50–1.10)
GFR calc Af Amer: >90 mL/min (ref >90)
GFR calc non Af Amer: >90 mL/min (ref >90)
GLUCOSE: 100 mg/dL — AB (ref 70–99)
POTASSIUM: 3.3 mmol/L — AB (ref 3.5–5.1)
SODIUM: 139 mmol/L (ref 135–145)
Total Bilirubin: 0.5 mg/dL (ref 0.3–1.2)
Total Protein: 6.6 g/dL (ref 6.0–8.3)

## 2014-11-02 LAB — URINE MICROSCOPIC-ADD ON

## 2014-11-02 LAB — CBC WITH DIFFERENTIAL/PLATELET
BASOS PCT: 1 % (ref 0–1)
Basophils Absolute: 0.1 10*3/uL (ref 0.0–0.1)
Eosinophils Absolute: 0.2 10*3/uL (ref 0.0–0.7)
Eosinophils Relative: 2 % (ref 0–5)
HCT: 39.2 % (ref 36.0–46.0)
Hemoglobin: 12.9 g/dL (ref 12.0–15.0)
Lymphocytes Relative: 23 % (ref 12–46)
Lymphs Abs: 2.2 10*3/uL (ref 0.7–4.0)
MCH: 28.5 pg (ref 26.0–34.0)
MCHC: 32.9 g/dL (ref 30.0–36.0)
MCV: 86.5 fL (ref 78.0–100.0)
Monocytes Absolute: 0.8 10*3/uL (ref 0.1–1.0)
Monocytes Relative: 9 % (ref 3–12)
NEUTROS ABS: 6.1 10*3/uL (ref 1.7–7.7)
NEUTROS PCT: 65 % (ref 43–77)
PLATELETS: 324 10*3/uL (ref 150–400)
RBC: 4.53 MIL/uL (ref 3.87–5.11)
RDW: 13 % (ref 11.5–15.5)
WBC: 9.4 10*3/uL (ref 4.0–10.5)

## 2014-11-02 LAB — URINALYSIS, ROUTINE W REFLEX MICROSCOPIC
BILIRUBIN URINE: NEGATIVE
Glucose, UA: NEGATIVE mg/dL
KETONES UR: NEGATIVE mg/dL
Leukocytes, UA: NEGATIVE
Nitrite: NEGATIVE
PROTEIN: NEGATIVE mg/dL
Specific Gravity, Urine: 1.004 — ABNORMAL LOW (ref 1.005–1.030)
UROBILINOGEN UA: 0.2 mg/dL (ref 0.0–1.0)
pH: 7.5 (ref 5.0–8.0)

## 2014-11-02 LAB — CK: Total CK: 72 U/L (ref 7–177)

## 2014-11-02 LAB — LIPASE, BLOOD: Lipase: 20 U/L (ref 11–59)

## 2014-11-02 MED ORDER — SODIUM CHLORIDE 0.9 % IV BOLUS (SEPSIS)
1000.0000 mL | Freq: Once | INTRAVENOUS | Status: AC
  Administered 2014-11-02: 1000 mL via INTRAVENOUS

## 2014-11-02 MED ORDER — ONDANSETRON HCL 4 MG/2ML IJ SOLN
4.0000 mg | Freq: Once | INTRAMUSCULAR | Status: AC
Start: 2014-11-02 — End: 2014-11-02
  Administered 2014-11-02: 4 mg via INTRAVENOUS
  Filled 2014-11-02: qty 2

## 2014-11-02 MED ORDER — POTASSIUM CHLORIDE CRYS ER 20 MEQ PO TBCR
60.0000 meq | EXTENDED_RELEASE_TABLET | Freq: Once | ORAL | Status: AC
  Administered 2014-11-02: 60 meq via ORAL
  Filled 2014-11-02: qty 3

## 2014-11-02 MED ORDER — HYDROMORPHONE HCL 1 MG/ML IJ SOLN
1.0000 mg | Freq: Once | INTRAMUSCULAR | Status: AC
  Administered 2014-11-02: 1 mg via INTRAVENOUS
  Filled 2014-11-02: qty 1

## 2014-11-02 MED ORDER — DIAZEPAM 2 MG PO TABS: 2.0000 mg | ORAL_TABLET | ORAL | Status: DC | PRN

## 2014-11-02 MED ORDER — PREDNISONE 20 MG PO TABS: 40.0000 mg | ORAL_TABLET | Freq: Every day | ORAL | Status: DC

## 2014-11-02 NOTE — ED Notes (Signed)
Pt remains in bathroom.  Husband with pt.

## 2014-11-02 NOTE — ED Notes (Signed)
Pt transported to CT ?

## 2014-11-02 NOTE — ED Provider Notes (Signed)
Patient signed out to me by Dr. Claudine Mouton an MRI results reviewed with patient. Patient already has opiates at home and will add Valium low-dose. Neurological exam stable at discharge.  Leota Jacobsen, MD 11/02/14 [phone]

## 2014-11-02 NOTE — ED Provider Notes (Signed)
CSN: [id number]     Arrival date & time 11/02/14  0320 History   First MD Initiated Contact with Patient 11/02/14 [phone]     Chief Complaint  Patient presents with  . Flank Pain     (Consider location/radiation/quality/duration/timing/severity/associated sxs/prior Treatment) HPI  [name] is a 64 y.o. female with past medical history of basal cell skin cancer presenting today with right-sided flank pain. Patient is a bounce back to the emergency department. She states she was here a couple of days ago and diagnosed with diverticulitis, she has been taking her antibiotics. Her bilateral lower quadrant abdominal pain has resolved however she is now having worsening right flank pain. This woke her up out of sleep. She denies any nausea vomiting or changes in her urination or bowel movements. She also has right lower extremity weakness associated with this. Patient states the pain was so severe she almost fell over. She denies taking any medications, movement makes the pain worse. Patient denies any fevers. She has no further complaints.  10 Systems reviewed and are negative for acute change except as noted in the HPI.     Past Medical History  Diagnosis Date  . Depression   . Anxiety   . OA (osteoarthritis)     HANDS  . Allergic rhinitis   . Diverticulosis     HEMMORHOIDS ON COLONOSCOPY  . TMJ syndrome   . Vitamin D deficiency   . IBS (irritable bowel syndrome)     DIARRHEA PRONE  . Skin cancer, basal cell     HX skin cancer per patient   Past Surgical History  Procedure Laterality Date  . Bone spurs removal      TOES  . Tubal ligation    . Breast biopsy      RIGHT  . Breast enhancement surgery      THEN REMOVED  . Vesicovaginal fistula closure w/ tah      WITHOUT OOPHS  . Shoulder surgery      RIGHT  . Foot surgery      Right   Family History  Problem Relation Age of Onset  . Hypertension Mother   . Cancer - Other Mother   . Lung cancer Father   . Cancer -  Other Brother   . CVA Brother   . Heart attack Brother   . AAA (abdominal aortic aneurysm) Sister   . Depression Sister    History  Substance Use Topics  . Smoking status: Never Smoker   . Smokeless tobacco: Not on file  . Alcohol Use: No   OB History    No data available     Review of Systems    Allergies  Ketorolac tromethamine; Morphine and related; Percocet; and Zoloft  Home Medications   Prior to Admission medications   Medication Sig Start Date End Date Taking? Authorizing Provider  Cholecalciferol (VITAMIN D) 2000 UNITS CAPS Take 2,000 Units by mouth daily.    Historical Provider, MD  ciprofloxacin (CIPRO) 500 MG tablet Take 1 tablet (500 mg total) by mouth 2 (two) times daily. One po bid x 7 days 10/31/14   Maudry Diego, MD  HYDROcodone-acetaminophen (NORCO/VICODIN) 5-325 MG per tablet Take 1 tablet by mouth every 6 (six) hours as needed for moderate pain. 10/31/14   Maudry Diego, MD  ibuprofen (ADVIL,MOTRIN) 200 MG tablet Take 600-800 mg by mouth every 6 (six) hours as needed for moderate pain.     Historical Provider, MD  metroNIDAZOLE (FLAGYL) 500  MG tablet Take 1 tablet (500 mg total) by mouth 4 (four) times daily. One po bid x 7 days 10/31/14   Maudry Diego, MD  ondansetron (ZOFRAN ODT) 4 MG disintegrating tablet 4mg  ODT q4 hours prn nausea/vomit 10/31/14   Maudry Diego, MD  Vitamin D, Ergocalciferol, (DRISDOL) 50000 UNITS CAPS capsule Take 50,000 Units by mouth every 7 (seven) days.    Historical Provider, MD   BP 126/54 mmHg  Pulse 78  Temp(Src) 98.1 F (36.7 C) (Oral)  Resp 18  Ht 5\' 4"  (1.626 m)  Wt 175 lb (79.379 kg)  BMI 30.02 kg/m2  SpO2 97% Physical Exam  Constitutional: She is oriented to person, place, and time. She appears well-developed and well-nourished. No distress.  HENT:  Head: Normocephalic and atraumatic.  Nose: Nose normal.  Mouth/Throat: Oropharynx is clear and moist. No oropharyngeal exudate.  Eyes: Conjunctivae and EOM are  normal. Pupils are equal, round, and reactive to light. No scleral icterus.  Neck: Normal range of motion. Neck supple. No JVD present. No tracheal deviation present. No thyromegaly present.  Cardiovascular: Normal rate, regular rhythm and normal heart sounds.  Exam reveals no gallop and no friction rub.   No murmur heard. Pulmonary/Chest: Effort normal and breath sounds normal. No respiratory distress. She has no wheezes. She exhibits no tenderness.  Abdominal: Soft. Bowel sounds are normal. She exhibits no distension and no mass. There is no tenderness. There is no rebound and no guarding.  Musculoskeletal: Normal range of motion. She exhibits tenderness. She exhibits no edema.  Right flank tenderness to palpation, and the paralumbar spinal area. Negative straight leg raise test.  Lymphadenopathy:    She has no cervical adenopathy.  Neurological: She is alert and oriented to person, place, and time. No cranial nerve deficit. She exhibits normal muscle tone.  Right lower extremity is more weak than the left.  Skin: Skin is warm and dry. No rash noted. She is not diaphoretic. No erythema. No pallor.  Nursing note and vitals reviewed.   ED Course  Procedures (including critical care time) Labs Review Labs Reviewed  CBC WITH DIFFERENTIAL/PLATELET  URINALYSIS, ROUTINE W REFLEX MICROSCOPIC  COMPREHENSIVE METABOLIC PANEL  LIPASE, BLOOD  CK    Imaging Review Ct Abdomen Pelvis Wo Contrast  11/02/2014   CLINICAL DATA:  Right flank pain.  Recent imaging for diverticulitis  EXAM: CT ABDOMEN AND PELVIS WITHOUT CONTRAST  TECHNIQUE: Multidetector CT imaging of the abdomen and pelvis was performed following the standard protocol without IV contrast.  COMPARISON:  10/31/2014  FINDINGS: BODY WALL: Unremarkable.  LOWER CHEST: Unremarkable.  ABDOMEN/PELVIS:  Liver: No focal abnormality.  Biliary: No evidence of biliary obstruction or stone.  Pancreas: Unremarkable.  Spleen: Unremarkable.  Adrenals:  Unremarkable.  Kidneys and ureters: No hydronephrosis or stone.  Bladder: Unremarkable.  Reproductive: Hysterectomy.  The ovaries are negative.  Bowel: Sigmoid diverticulitis is again noted with focal wall thickening and pericolonic fat infiltration. Oral contrast fills the segment, showing there has been no interval perforation. No pneumoperitoneum or abscess. Normal appendix.  Retroperitoneum: No mass or adenopathy.  Peritoneum: No ascites or pneumoperitoneum.  Vascular: No acute abnormality.  OSSEOUS: No acute abnormalities.  IMPRESSION: 1. No explanation for new right flank pain. 2. Sigmoid diverticulitis without perforation or progression since 10/31/2014.   Electronically Signed   By: Monte Fantasia M.D.   On: 11/02/2014 05:40   Ct Abdomen Pelvis W Contrast  10/31/2014   CLINICAL DATA:  Bilateral lower abdominal pain starting last  Thursday with worsening pain  EXAM: CT ABDOMEN AND PELVIS WITH CONTRAST  TECHNIQUE: Multidetector CT imaging of the abdomen and pelvis was performed using the standard protocol following bolus administration of intravenous contrast.  CONTRAST:  144mL OMNIPAQUE IOHEXOL 300 MG/ML  SOLN  COMPARISON:  10/20/2010  FINDINGS: Sagittal images of the spine shows mild degenerative changes thoracolumbar spine.  Lung bases are unremarkable.  Enhanced liver shows no focal mass. Mild distended gallbladder without evidence of calcified gallstones. Enhanced pancreas, spleen and adrenal glands are unremarkable. Enhanced kidneys are symmetrical in size. No hydronephrosis or hydroureter.  No aortic aneurysm.  Delayed renal images shows bilateral renal symmetrical excretion. Bilateral visualized proximal ureter is unremarkable.  No small bowel obstruction.  No ascites or free air.  No adenopathy.  There is no pericecal inflammation. The terminal ileum is unremarkable. Normal appendix partially visualized in coronal image 50.  Descending colon diverticula are noted. No evidence of acute  diverticulitis. The patient is status post hysterectomy. Multiple sigmoid colon diverticula are noted. In axial image 78 there is mild thickening of colonic wall mid sigmoid colon in left pelvis with mild stranding of pericolonic fat. A inflamed colonic diverticulum is noted in axial image 75. Findings are consistent with acute diverticulitis or focal colitis. There is no diverticular abscess. No pericolonic abscess. The urinary bladder is unremarkable.  IMPRESSION: 1. Multiple sigmoid colon diverticula are noted. There is short segment thickening of mid sigmoid colon wall and stranding of pericolonic fat. Mild inflamed colonic diverticulum is noted at this level in axial image 75. Findings are consistent with acute diverticulitis or focal colitis. There is no evidence of diverticular or pericolonic abscess. 2. Status post hysterectomy. There are descending colon diverticula. No evidence of acute diverticulitis. 3. Normal appendix.  No pericecal inflammation. 4. No hydronephrosis or hydroureter. Bilateral renal symmetrical excretion. 5. Degenerative changes thoracolumbar spine.   Electronically Signed   By: Lahoma Crocker M.D.   On: 10/31/2014 10:01     EKG Interpretation None      MDM   Final diagnoses:  Right flank pain    Patient since emergency department for right flank pain. Will obtain CT scan without contrast to assess for nephrolithiasis. Patient was given IV fluids, Dilaudid, Zofran for management. Upon repeat evaluation the patient she states her pain is improved but not gone. CT scan does not show any kidney stones. Because patient has right lower extremity weakness (which may turn out to be secondary to pain)she will need MRI for evaluation. Patient will be signed out to oncoming provider, please see their note for ultimate disposition of this patient. If MRI is negative for any discitis or cord compression, the patient will be safe for discharge with primary care follow-up within 3  days.    Everlene Balls, MD 11/02/14 [phone]

## 2014-11-02 NOTE — Discharge Instructions (Signed)
Back Pain, Adult Low back pain is very common. About 1 in 5 people have back pain.The cause of low back pain is rarely dangerous. The pain often gets better over time.About half of people with a sudden onset of back pain feel better in just 2 weeks. About 8 in 10 people feel better by 6 weeks.  CAUSES Some common causes of back pain include:  Strain of the muscles or ligaments supporting the spine.  Wear and tear (degeneration) of the spinal discs.  Arthritis.  Direct injury to the back. DIAGNOSIS Most of the time, the direct cause of low back pain is not known.However, back pain can be treated effectively even when the exact cause of the pain is unknown.Answering your caregiver's questions about your overall health and symptoms is one of the most accurate ways to make sure the cause of your pain is not dangerous. If your caregiver needs more information, he or she may order lab work or imaging tests (X-rays or MRIs).However, even if imaging tests show changes in your back, this usually does not require surgery. HOME CARE INSTRUCTIONS For many people, back pain returns.Since low back pain is rarely dangerous, it is often a condition that people can learn to manageon their own.   Remain active. It is stressful on the back to sit or stand in one place. Do not sit, drive, or stand in one place for more than 30 minutes at a time. Take short walks on level surfaces as soon as pain allows.Try to increase the length of time you walk each day.  Do not stay in bed.Resting more than 1 or 2 days can delay your recovery.  Do not avoid exercise or work.Your body is made to move.It is not dangerous to be active, even though your back may hurt.Your back will likely heal faster if you return to being active before your pain is gone.  Pay attention to your body when you bend and lift. Many people have less discomfortwhen lifting if they bend their knees, keep the load close to their bodies,and  avoid twisting. Often, the most comfortable positions are those that put less stress on your recovering back.  Find a comfortable position to sleep. Use a firm mattress and lie on your side with your knees slightly bent. If you lie on your back, put a pillow under your knees.  Only take over-the-counter or prescription medicines as directed by your caregiver. Over-the-counter medicines to reduce pain and inflammation are often the most helpful.Your caregiver may prescribe muscle relaxant drugs.These medicines help dull your pain so you can more quickly return to your normal activities and healthy exercise.  Put ice on the injured area.  Put ice in a plastic bag.  Place a towel between your skin and the bag.  Leave the ice on for 15-20 minutes, 03-04 times a day for the first 2 to 3 days. After that, ice and heat may be alternated to reduce pain and spasms.  Ask your caregiver about trying back exercises and gentle massage. This may be of some benefit.  Avoid feeling anxious or stressed.Stress increases muscle tension and can worsen back pain.It is important to recognize when you are anxious or stressed and learn ways to manage it.Exercise is a great option. SEEK MEDICAL CARE IF:  You have pain that is not relieved with rest or medicine.  You have pain that does not improve in 1 week.  You have new symptoms.  You are generally not feeling well. SEEK   IMMEDIATE MEDICAL CARE IF:   You have pain that radiates from your back into your legs.  You develop new bowel or bladder control problems.  You have unusual weakness or numbness in your arms or legs.  You develop nausea or vomiting.  You develop abdominal pain.  You feel faint. Document Released: 08/16/2005 Document Revised: 02/15/2012 Document Reviewed: 12/18/2013 [employer] Patient Information 2015 [employer]. This information is not intended to replace advice given to you by your health care provider. Make sure you  discuss any questions you have with your health care provider.  

## 2014-11-02 NOTE — ED Notes (Signed)
Called for triage x 1.  Pt had to use bathroom.

## 2014-11-02 NOTE — ED Notes (Addendum)
Pt reports R flank pain since Friday.  States she was seen in ED on 3/3 for lower abd pain and lower back pain but states all her pain is now concentrated on R Flank area.  Denies nausea, vomiting, and diarrhea.  Denies urinary complaint.  Pain worse with movement.

## 2014-12-13 ENCOUNTER — Ambulatory Visit (INDEPENDENT_AMBULATORY_CARE_PROVIDER_SITE_OTHER): Payer: PPO | Admitting: Podiatry

## 2014-12-13 ENCOUNTER — Other Ambulatory Visit: Payer: Self-pay | Admitting: Podiatry

## 2014-12-13 ENCOUNTER — Ambulatory Visit (INDEPENDENT_AMBULATORY_CARE_PROVIDER_SITE_OTHER): Payer: PPO

## 2014-12-13 DIAGNOSIS — M722 Plantar fascial fibromatosis: Secondary | ICD-10-CM

## 2014-12-13 DIAGNOSIS — M779 Enthesopathy, unspecified: Secondary | ICD-10-CM

## 2014-12-13 MED ORDER — TRIAMCINOLONE ACETONIDE 10 MG/ML IJ SUSP
10.0000 mg | Freq: Once | INTRAMUSCULAR | Status: AC
  Administered 2014-12-13: 10 mg

## 2014-12-16 NOTE — Progress Notes (Signed)
Subjective:     Patient ID: [name], female   DOB: 11/14/1950, 65 y.o.   MRN: [id number]  HPI patient points to along the side of the right foot stating it's been very tender and making it hard to walk with. States that it's been going on for a couple months   Review of Systems  All other systems reviewed and are negative.      Objective:   Physical Exam  Constitutional: She is oriented to person, place, and time.  Cardiovascular: Intact distal pulses.   Musculoskeletal: Normal range of motion.  Neurological: She is oriented to person, place, and time.  Skin: Skin is warm.  Nursing note and vitals reviewed.  neurovascular status intact with muscle strength adequate range of motion subtalar midtarsal joint within normal limits. Patient's noted to have discomfort in the right foot lateral side distal to the insertion of peroneal tendon with inflammation and fluid buildup upon palpation. Patient has good digital perfusion is well oriented 3     Assessment:     Tendinitis right lateral foot with inflammation and fluid buildup    Plan:     H&P and x-rays reviewed and today I did a lateral injection 3 mg Kenalog 5 mg Xylocaine and advised on physical therapy. Reappoint for [place] to recheck

## 2015-05-30 NOTE — Progress Notes (Signed)
   Subjective:    Patient ID: [name], female    DOB: 1951-01-26, 64 y.o.   MRN: [id number]  HPI    Review of Systems     Objective:   Physical Exam        Assessment & Plan:

## 2015-08-26 ENCOUNTER — Ambulatory Visit: Payer: PPO | Admitting: Primary Care

## 2015-09-03 DIAGNOSIS — C44212 Basal cell carcinoma of skin of right ear and external auricular canal: Secondary | ICD-10-CM | POA: Diagnosis not present

## 2015-09-03 DIAGNOSIS — X32XXXD Exposure to sunlight, subsequent encounter: Secondary | ICD-10-CM | POA: Diagnosis not present

## 2015-09-03 DIAGNOSIS — Z08 Encounter for follow-up examination after completed treatment for malignant neoplasm: Secondary | ICD-10-CM | POA: Diagnosis not present

## 2015-09-03 DIAGNOSIS — C44310 Basal cell carcinoma of skin of unspecified parts of face: Secondary | ICD-10-CM | POA: Diagnosis not present

## 2015-09-03 DIAGNOSIS — Z85828 Personal history of other malignant neoplasm of skin: Secondary | ICD-10-CM | POA: Diagnosis not present

## 2015-09-03 DIAGNOSIS — L570 Actinic keratosis: Secondary | ICD-10-CM | POA: Diagnosis not present

## 2015-09-04 DIAGNOSIS — H16223 Keratoconjunctivitis sicca, not specified as Sjogren's, bilateral: Secondary | ICD-10-CM | POA: Diagnosis not present

## 2015-10-23 DIAGNOSIS — M7071 Other bursitis of hip, right hip: Secondary | ICD-10-CM | POA: Diagnosis not present

## 2015-10-31 DIAGNOSIS — Z08 Encounter for follow-up examination after completed treatment for malignant neoplasm: Secondary | ICD-10-CM | POA: Diagnosis not present

## 2015-10-31 DIAGNOSIS — Z85828 Personal history of other malignant neoplasm of skin: Secondary | ICD-10-CM | POA: Diagnosis not present

## 2015-10-31 DIAGNOSIS — L905 Scar conditions and fibrosis of skin: Secondary | ICD-10-CM | POA: Diagnosis not present

## 2015-11-22 DIAGNOSIS — M25511 Pain in right shoulder: Secondary | ICD-10-CM | POA: Diagnosis not present

## 2016-03-24 DIAGNOSIS — C44311 Basal cell carcinoma of skin of nose: Secondary | ICD-10-CM | POA: Diagnosis not present

## 2016-03-24 DIAGNOSIS — L570 Actinic keratosis: Secondary | ICD-10-CM | POA: Diagnosis not present

## 2016-03-24 DIAGNOSIS — X32XXXD Exposure to sunlight, subsequent encounter: Secondary | ICD-10-CM | POA: Diagnosis not present

## 2016-03-24 DIAGNOSIS — Z1231 Encounter for screening mammogram for malignant neoplasm of breast: Secondary | ICD-10-CM | POA: Diagnosis not present

## 2016-04-05 DIAGNOSIS — H16223 Keratoconjunctivitis sicca, not specified as Sjogren's, bilateral: Secondary | ICD-10-CM | POA: Diagnosis not present

## 2016-04-21 DIAGNOSIS — C44311 Basal cell carcinoma of skin of nose: Secondary | ICD-10-CM | POA: Diagnosis not present

## 2016-05-17 DIAGNOSIS — N390 Urinary tract infection, site not specified: Secondary | ICD-10-CM | POA: Diagnosis not present

## 2016-05-30 ENCOUNTER — Encounter (HOSPITAL_COMMUNITY): Payer: Self-pay | Admitting: Emergency Medicine

## 2016-05-30 DIAGNOSIS — R740 Nonspecific elevation of levels of transaminase and lactic acid dehydrogenase [LDH]: Secondary | ICD-10-CM | POA: Diagnosis not present

## 2016-05-30 DIAGNOSIS — K851 Biliary acute pancreatitis without necrosis or infection: Principal | ICD-10-CM | POA: Diagnosis present

## 2016-05-30 DIAGNOSIS — Z823 Family history of stroke: Secondary | ICD-10-CM

## 2016-05-30 DIAGNOSIS — Z85828 Personal history of other malignant neoplasm of skin: Secondary | ICD-10-CM

## 2016-05-30 DIAGNOSIS — M1990 Unspecified osteoarthritis, unspecified site: Secondary | ICD-10-CM | POA: Diagnosis present

## 2016-05-30 DIAGNOSIS — R112 Nausea with vomiting, unspecified: Secondary | ICD-10-CM | POA: Diagnosis not present

## 2016-05-30 DIAGNOSIS — K819 Cholecystitis, unspecified: Secondary | ICD-10-CM | POA: Diagnosis not present

## 2016-05-30 DIAGNOSIS — R739 Hyperglycemia, unspecified: Secondary | ICD-10-CM | POA: Diagnosis not present

## 2016-05-30 DIAGNOSIS — R932 Abnormal findings on diagnostic imaging of liver and biliary tract: Secondary | ICD-10-CM | POA: Diagnosis not present

## 2016-05-30 DIAGNOSIS — Z801 Family history of malignant neoplasm of trachea, bronchus and lung: Secondary | ICD-10-CM | POA: Diagnosis not present

## 2016-05-30 DIAGNOSIS — K859 Acute pancreatitis without necrosis or infection, unspecified: Secondary | ICD-10-CM | POA: Diagnosis not present

## 2016-05-30 DIAGNOSIS — K8020 Calculus of gallbladder without cholecystitis without obstruction: Secondary | ICD-10-CM | POA: Diagnosis not present

## 2016-05-30 DIAGNOSIS — D72829 Elevated white blood cell count, unspecified: Secondary | ICD-10-CM | POA: Diagnosis not present

## 2016-05-30 DIAGNOSIS — K811 Chronic cholecystitis: Secondary | ICD-10-CM | POA: Diagnosis not present

## 2016-05-30 DIAGNOSIS — Z8249 Family history of ischemic heart disease and other diseases of the circulatory system: Secondary | ICD-10-CM

## 2016-05-30 DIAGNOSIS — K8010 Calculus of gallbladder with chronic cholecystitis without obstruction: Secondary | ICD-10-CM | POA: Diagnosis not present

## 2016-05-30 DIAGNOSIS — R1013 Epigastric pain: Secondary | ICD-10-CM | POA: Diagnosis not present

## 2016-05-30 DIAGNOSIS — N390 Urinary tract infection, site not specified: Secondary | ICD-10-CM | POA: Diagnosis not present

## 2016-05-30 NOTE — ED Triage Notes (Signed)
Pt. reports upper/mid abdominal pain with emesis , constipation and diarrhea onset this evening , denies fever or chills.

## 2016-05-31 ENCOUNTER — Emergency Department (HOSPITAL_COMMUNITY): Payer: PPO

## 2016-05-31 ENCOUNTER — Inpatient Hospital Stay (HOSPITAL_COMMUNITY)
Admission: EM | Admit: 2016-05-31 | Discharge: 2016-06-02 | DRG: 418 | Disposition: A | Discharge status: Home / Self Care | Payer: PPO | Attending: Internal Medicine | Admitting: Internal Medicine

## 2016-05-31 ENCOUNTER — Observation Stay (HOSPITAL_COMMUNITY): Payer: PPO

## 2016-05-31 ENCOUNTER — Encounter (HOSPITAL_COMMUNITY): Payer: Self-pay | Admitting: Gastroenterology

## 2016-05-31 DIAGNOSIS — R1013 Epigastric pain: Secondary | ICD-10-CM | POA: Diagnosis not present

## 2016-05-31 DIAGNOSIS — R740 Nonspecific elevation of levels of transaminase and lactic acid dehydrogenase [LDH]: Secondary | ICD-10-CM | POA: Diagnosis not present

## 2016-05-31 DIAGNOSIS — K811 Chronic cholecystitis: Secondary | ICD-10-CM | POA: Diagnosis not present

## 2016-05-31 DIAGNOSIS — R932 Abnormal findings on diagnostic imaging of liver and biliary tract: Secondary | ICD-10-CM | POA: Diagnosis not present

## 2016-05-31 DIAGNOSIS — Z801 Family history of malignant neoplasm of trachea, bronchus and lung: Secondary | ICD-10-CM | POA: Diagnosis not present

## 2016-05-31 DIAGNOSIS — R739 Hyperglycemia, unspecified: Secondary | ICD-10-CM | POA: Diagnosis present

## 2016-05-31 DIAGNOSIS — Z8249 Family history of ischemic heart disease and other diseases of the circulatory system: Secondary | ICD-10-CM | POA: Diagnosis not present

## 2016-05-31 DIAGNOSIS — N390 Urinary tract infection, site not specified: Secondary | ICD-10-CM | POA: Diagnosis not present

## 2016-05-31 DIAGNOSIS — K8020 Calculus of gallbladder without cholecystitis without obstruction: Secondary | ICD-10-CM | POA: Diagnosis not present

## 2016-05-31 DIAGNOSIS — Z419 Encounter for procedure for purposes other than remedying health state, unspecified: Secondary | ICD-10-CM

## 2016-05-31 DIAGNOSIS — K819 Cholecystitis, unspecified: Secondary | ICD-10-CM | POA: Diagnosis not present

## 2016-05-31 DIAGNOSIS — D72829 Elevated white blood cell count, unspecified: Secondary | ICD-10-CM | POA: Diagnosis not present

## 2016-05-31 DIAGNOSIS — K859 Acute pancreatitis without necrosis or infection, unspecified: Secondary | ICD-10-CM | POA: Diagnosis not present

## 2016-05-31 DIAGNOSIS — M1990 Unspecified osteoarthritis, unspecified site: Secondary | ICD-10-CM | POA: Diagnosis not present

## 2016-05-31 DIAGNOSIS — R112 Nausea with vomiting, unspecified: Secondary | ICD-10-CM | POA: Diagnosis present

## 2016-05-31 DIAGNOSIS — K8010 Calculus of gallbladder with chronic cholecystitis without obstruction: Secondary | ICD-10-CM | POA: Diagnosis not present

## 2016-05-31 DIAGNOSIS — Z85828 Personal history of other malignant neoplasm of skin: Secondary | ICD-10-CM | POA: Diagnosis not present

## 2016-05-31 DIAGNOSIS — Z823 Family history of stroke: Secondary | ICD-10-CM | POA: Diagnosis not present

## 2016-05-31 DIAGNOSIS — K851 Biliary acute pancreatitis without necrosis or infection: Secondary | ICD-10-CM | POA: Diagnosis not present

## 2016-05-31 LAB — COMPREHENSIVE METABOLIC PANEL
ALBUMIN: 4.2 g/dL (ref 3.5–5.0)
ALT: 35 U/L (ref 14–54)
ALT: 37 U/L (ref 14–54)
ANION GAP: 11 (ref 5–15)
AST: 55 U/L — ABNORMAL HIGH (ref 15–41)
AST: 47 U/L — AB (ref 15–41)
Albumin: 3.7 g/dL (ref 3.5–5.0)
Alkaline Phosphatase: 101 U/L (ref 38–126)
Alkaline Phosphatase: 83 U/L (ref 38–126)
Anion gap: 8 (ref 5–15)
BILIRUBIN TOTAL: 0.8 mg/dL (ref 0.3–1.2)
BUN: 8 mg/dL (ref 6–20)
BUN: 5 mg/dL — ABNORMAL LOW (ref 6–20)
CHLORIDE: 106 mmol/L (ref 101–111)
CO2: 22 mmol/L (ref 22–32)
CO2: 22 mmol/L (ref 22–32)
CREATININE: 0.59 mg/dL (ref 0.44–1.00)
CREATININE: 0.77 mg/dL (ref 0.44–1.00)
Calcium: 10.1 mg/dL (ref 8.9–10.3)
Calcium: 8.6 mg/dL — ABNORMAL LOW (ref 8.9–10.3)
Chloride: 107 mmol/L (ref 101–111)
GFR calc non Af Amer: >60 mL/min (ref >60)
GLUCOSE: 182 mg/dL — AB (ref 65–99)
Glucose, Bld: 160 mg/dL — ABNORMAL HIGH (ref 65–99)
POTASSIUM: 3.3 mmol/L — AB (ref 3.5–5.1)
Potassium: 3.5 mmol/L (ref 3.5–5.1)
SODIUM: 139 mmol/L (ref 135–145)
Sodium: 137 mmol/L (ref 135–145)
TOTAL PROTEIN: 5.8 g/dL — AB (ref 6.5–8.1)
Total Bilirubin: 0.7 mg/dL (ref 0.3–1.2)
Total Protein: 6.6 g/dL (ref 6.5–8.1)

## 2016-05-31 LAB — CBC
HCT: 44.3 % (ref 36.0–46.0)
HEMATOCRIT: 40.9 % (ref 36.0–46.0)
HEMOGLOBIN: 14.5 g/dL (ref 12.0–15.0)
Hemoglobin: 13.2 g/dL (ref 12.0–15.0)
MCH: 28.2 pg (ref 26.0–34.0)
MCH: 28.5 pg (ref 26.0–34.0)
MCHC: 32.3 g/dL (ref 30.0–36.0)
MCHC: 32.7 g/dL (ref 30.0–36.0)
MCV: 87 fL (ref 78.0–100.0)
MCV: 87.4 fL (ref 78.0–100.0)
PLATELETS: 258 10*3/uL (ref 150–400)
Platelets: 300 10*3/uL (ref 150–400)
RBC: 4.68 MIL/uL (ref 3.87–5.11)
RBC: 5.09 MIL/uL (ref 3.87–5.11)
RDW: 13.8 % (ref 11.5–15.5)
RDW: 13.8 % (ref 11.5–15.5)
WBC: 12.2 10*3/uL — AB (ref 4.0–10.5)
WBC: 14.9 10*3/uL — ABNORMAL HIGH (ref 4.0–10.5)

## 2016-05-31 LAB — URINE MICROSCOPIC-ADD ON

## 2016-05-31 LAB — SURGICAL PCR SCREEN
MRSA, PCR: NEGATIVE
STAPHYLOCOCCUS AUREUS: NEGATIVE

## 2016-05-31 LAB — URINALYSIS, ROUTINE W REFLEX MICROSCOPIC
Bilirubin Urine: NEGATIVE
Glucose, UA: NEGATIVE mg/dL
Ketones, ur: 15 mg/dL — AB
Leukocytes, UA: NEGATIVE
Nitrite: NEGATIVE
PROTEIN: NEGATIVE mg/dL
Specific Gravity, Urine: 1.025 (ref 1.005–1.030)
pH: 5.5 (ref 5.0–8.0)

## 2016-05-31 LAB — LIPID PANEL
CHOL/HDL RATIO: 3.3 ratio
CHOLESTEROL: 143 mg/dL (ref 0–200)
HDL: 44 mg/dL (ref >40)
LDL Cholesterol: 81 mg/dL (ref 0–99)
TRIGLYCERIDES: 91 mg/dL (ref <150)
VLDL: 18 mg/dL (ref 0–40)

## 2016-05-31 LAB — LIPASE, BLOOD: LIPASE: 1994 U/L — AB (ref 11–51)

## 2016-05-31 MED ORDER — ONDANSETRON HCL 4 MG/2ML IJ SOLN
4.0000 mg | Freq: Once | INTRAMUSCULAR | Status: AC
  Administered 2016-05-31: 4 mg via INTRAVENOUS
  Filled 2016-05-31: qty 2

## 2016-05-31 MED ORDER — PROMETHAZINE HCL 25 MG/ML IJ SOLN
12.5000 mg | Freq: Four times a day (QID) | INTRAMUSCULAR | Status: DC | PRN
  Administered 2016-05-31: 12.5 mg via INTRAVENOUS
  Filled 2016-05-31: qty 1

## 2016-05-31 MED ORDER — HYDROMORPHONE HCL 1 MG/ML IJ SOLN
1.0000 mg | INTRAMUSCULAR | Status: DC | PRN
  Administered 2016-05-31 – 2016-06-01 (×6): 1 mg via INTRAVENOUS
  Filled 2016-05-31 (×7): qty 1

## 2016-05-31 MED ORDER — IOPAMIDOL (ISOVUE-300) INJECTION 61%
INTRAVENOUS | Status: AC
  Filled 2016-05-31: qty 100

## 2016-05-31 MED ORDER — SODIUM CHLORIDE 0.9 % IV BOLUS (SEPSIS)
1000.0000 mL | Freq: Once | INTRAVENOUS | Status: AC
  Administered 2016-05-31: 1000 mL via INTRAVENOUS

## 2016-05-31 MED ORDER — POTASSIUM CHLORIDE 10 MEQ/100ML IV SOLN
10.0000 meq | INTRAVENOUS | Status: AC
  Administered 2016-05-31 (×2): 10 meq via INTRAVENOUS
  Filled 2016-05-31 (×2): qty 100

## 2016-05-31 MED ORDER — HYDROMORPHONE HCL 1 MG/ML IJ SOLN
1.0000 mg | Freq: Once | INTRAMUSCULAR | Status: AC
  Administered 2016-05-31: 1 mg via INTRAVENOUS
  Filled 2016-05-31: qty 1

## 2016-05-31 MED ORDER — ACETAMINOPHEN 650 MG RE SUPP: 650.0000 mg | Freq: Four times a day (QID) | RECTAL | Status: DC | PRN

## 2016-05-31 MED ORDER — HYDROMORPHONE HCL 1 MG/ML IJ SOLN: 1.0000 mg | Freq: Once | INTRAMUSCULAR | Status: DC

## 2016-05-31 MED ORDER — ONDANSETRON HCL 4 MG PO TABS: 4.0000 mg | ORAL_TABLET | Freq: Four times a day (QID) | ORAL | Status: DC | PRN

## 2016-05-31 MED ORDER — POTASSIUM CHLORIDE 10 MEQ/100ML IV SOLN
10.0000 meq | Freq: Once | INTRAVENOUS | Status: AC
  Administered 2016-05-31: 10 meq via INTRAVENOUS
  Filled 2016-05-31: qty 100

## 2016-05-31 MED ORDER — ONDANSETRON HCL 4 MG/2ML IJ SOLN
4.0000 mg | Freq: Four times a day (QID) | INTRAMUSCULAR | Status: DC | PRN
  Administered 2016-05-31: 4 mg via INTRAVENOUS
  Filled 2016-05-31 (×2): qty 2

## 2016-05-31 MED ORDER — DOCUSATE SODIUM 100 MG PO CAPS
100.0000 mg | ORAL_CAPSULE | Freq: Two times a day (BID) | ORAL | Status: DC
  Administered 2016-05-31 – 2016-06-02 (×5): 100 mg via ORAL
  Filled 2016-05-31 (×4): qty 1

## 2016-05-31 MED ORDER — ALBUTEROL SULFATE (2.5 MG/3ML) 0.083% IN NEBU: 2.5000 mg | INHALATION_SOLUTION | RESPIRATORY_TRACT | Status: DC | PRN

## 2016-05-31 MED ORDER — DEXTROSE 5 % IV SOLN
1.0000 g | INTRAVENOUS | Status: DC
  Administered 2016-05-31 – 2016-06-02 (×3): 1 g via INTRAVENOUS
  Filled 2016-05-31 (×3): qty 10

## 2016-05-31 MED ORDER — SODIUM CHLORIDE 0.9 % IV SOLN
INTRAVENOUS | Status: DC
  Administered 2016-05-31 – 2016-06-01 (×4): via INTRAVENOUS

## 2016-05-31 MED ORDER — ACETAMINOPHEN 325 MG PO TABS: 650.0000 mg | ORAL_TABLET | Freq: Four times a day (QID) | ORAL | Status: DC | PRN

## 2016-05-31 NOTE — ED Notes (Signed)
MD at bedside. 

## 2016-05-31 NOTE — Consult Note (Signed)
Reason for Consult: Gallstone pancreatitis Referring Physician: Hospital team  [name] is an 65 y.o. female.  HPI: Patient known to my partner Dr. B and her hospital computer chart and our office computer chart was reviewed in about a month ago she had a bout of mid epigastric right upper quadrant pain which was short lived but increased again last night with increased nausea vomiting and she presented to the emergency room and the above diagnosis was made and she has had ultrasounds and CTs in the past which have not revealed gallstones but she has had a bout of diverticulitis before and she is currently feeling better  Past Medical History:  Diagnosis Date  . Allergic rhinitis   . Anxiety   . Depression   . Diverticulosis    HEMMORHOIDS ON COLONOSCOPY  . IBS (irritable bowel syndrome)    DIARRHEA PRONE  . OA (osteoarthritis)    HANDS  . Skin cancer, basal cell    HX skin cancer per patient  . TMJ syndrome   . Vitamin D deficiency     Past Surgical History:  Procedure Laterality Date  . BONE SPURS REMOVAL     TOES  . BREAST BIOPSY     RIGHT  . BREAST ENHANCEMENT SURGERY     THEN REMOVED  . FOOT SURGERY     Right  . SHOULDER SURGERY     RIGHT  . TUBAL LIGATION    . VESICOVAGINAL FISTULA CLOSURE W/ TAH     WITHOUT OOPHS    Family History  Problem Relation Age of Onset  . Hypertension Mother   . Cancer - Other Mother   . Lung cancer Father   . Cancer - Other Brother   . CVA Brother   . Heart attack Brother   . AAA (abdominal aortic aneurysm) Sister   . Depression Sister     Social History:  reports that she has never smoked. She has never used smokeless tobacco. She reports that she does not drink alcohol or use drugs.  Allergies:  Allergies  Allergen Reactions  . Ketorolac Tromethamine Nausea And Vomiting  . Morphine And Related Other (See Comments)    Jumpy   . Percocet [Oxycodone-Acetaminophen] Nausea And Vomiting  . Zoloft [Sertraline Hcl]      DIARRHEA     Medications: I have reviewed the patient's current medications.  Results for orders placed or performed during the hospital encounter of 05/31/16 (from the past 48 hour(s))  Lipase, blood     Status: Abnormal   Collection Time: 05/30/16 11:36 PM  Result Value Ref Range   Lipase 1,994 (H) 11 - 51 U/L    Comment: RESULTS CONFIRMED BY MANUAL DILUTION  Comprehensive metabolic panel     Status: Abnormal   Collection Time: 05/30/16 11:36 PM  Result Value Ref Range   Sodium 139 135 - 145 mmol/L   Potassium 3.5 3.5 - 5.1 mmol/L   Chloride 106 101 - 111 mmol/L   CO2 22 22 - 32 mmol/L   Glucose, Bld 182 (H) 65 - 99 mg/dL   BUN 8 6 - 20 mg/dL   Creatinine, Ser 0.77 0.44 - 1.00 mg/dL   Calcium 10.1 8.9 - 10.3 mg/dL   Total Protein 6.6 6.5 - 8.1 g/dL   Albumin 4.2 3.5 - 5.0 g/dL   AST 55 (H) 15 - 41 U/L   ALT 37 14 - 54 U/L   Alkaline Phosphatase 101 38 - 126 U/L   Total Bilirubin  0.7 0.3 - 1.2 mg/dL   GFR calc non Af Amer >60 >60 mL/min   GFR calc Af Amer >60 >60 mL/min    Comment: (NOTE) The eGFR has been calculated using the CKD EPI equation. This calculation has not been validated in all clinical situations. eGFR's persistently <60 mL/min signify possible Chronic Kidney Disease.    Anion gap 11 5 - 15  CBC     Status: Abnormal   Collection Time: 05/30/16 11:36 PM  Result Value Ref Range   WBC 14.9 (H) 4.0 - 10.5 K/uL   RBC 5.09 3.87 - 5.11 MIL/uL   Hemoglobin 14.5 12.0 - 15.0 g/dL   HCT 44.3 36.0 - 46.0 %   MCV 87.0 78.0 - 100.0 fL   MCH 28.5 26.0 - 34.0 pg   MCHC 32.7 30.0 - 36.0 g/dL   RDW 13.8 11.5 - 15.5 %   Platelets 300 150 - 400 K/uL  Urinalysis, Routine w reflex microscopic     Status: Abnormal   Collection Time: 05/30/16 11:42 PM  Result Value Ref Range   Color, Urine YELLOW YELLOW   APPearance CLOUDY (A) CLEAR   Specific Gravity, Urine 1.025 1.005 - 1.030   pH 5.5 5.0 - 8.0   Glucose, UA NEGATIVE NEGATIVE mg/dL   Hgb urine dipstick SMALL (A)  NEGATIVE   Bilirubin Urine NEGATIVE NEGATIVE   Ketones, ur 15 (A) NEGATIVE mg/dL   Protein, ur NEGATIVE NEGATIVE mg/dL   Nitrite NEGATIVE NEGATIVE   Leukocytes, UA NEGATIVE NEGATIVE  Urine microscopic-add on     Status: Abnormal   Collection Time: 05/30/16 11:42 PM  Result Value Ref Range   Squamous Epithelial / LPF 0-5 (A) NONE SEEN   WBC, UA 0-5 0 - 5 WBC/hpf   RBC / HPF 0-5 0 - 5 RBC/hpf   Bacteria, UA FEW (A) NONE SEEN   Crystals CA OXALATE CRYSTALS (A) NEGATIVE   Urine-Other YEAST PRESENT     Comment: MUCOUS PRESENT  CBC     Status: Abnormal   Collection Time: 05/31/16  5:06 AM  Result Value Ref Range   WBC 12.2 (H) 4.0 - 10.5 K/uL   RBC 4.68 3.87 - 5.11 MIL/uL   Hemoglobin 13.2 12.0 - 15.0 g/dL   HCT 40.9 36.0 - 46.0 %   MCV 87.4 78.0 - 100.0 fL   MCH 28.2 26.0 - 34.0 pg   MCHC 32.3 30.0 - 36.0 g/dL   RDW 13.8 11.5 - 15.5 %   Platelets 258 150 - 400 K/uL  Comprehensive metabolic panel     Status: Abnormal   Collection Time: 05/31/16  5:06 AM  Result Value Ref Range   Sodium 137 135 - 145 mmol/L   Potassium 3.3 (L) 3.5 - 5.1 mmol/L   Chloride 107 101 - 111 mmol/L   CO2 22 22 - 32 mmol/L   Glucose, Bld 160 (H) 65 - 99 mg/dL   BUN 5 (L) 6 - 20 mg/dL   Creatinine, Ser 0.59 0.44 - 1.00 mg/dL   Calcium 8.6 (L) 8.9 - 10.3 mg/dL   Total Protein 5.8 (L) 6.5 - 8.1 g/dL   Albumin 3.7 3.5 - 5.0 g/dL   AST 47 (H) 15 - 41 U/L   ALT 35 14 - 54 U/L   Alkaline Phosphatase 83 38 - 126 U/L   Total Bilirubin 0.8 0.3 - 1.2 mg/dL   GFR calc non Af Amer >60 >60 mL/min   GFR calc Af Amer >60 >60 mL/min  Comment: (NOTE) The eGFR has been calculated using the CKD EPI equation. This calculation has not been validated in all clinical situations. eGFR's persistently <60 mL/min signify possible Chronic Kidney Disease.    Anion gap 8 5 - 15  Lipid panel     Status: None   Collection Time: 05/31/16  5:06 AM  Result Value Ref Range   Cholesterol 143 0 - 200 mg/dL   Triglycerides 91  <150 mg/dL   HDL 44 >40 mg/dL   Total CHOL/HDL Ratio 3.3 RATIO   VLDL 18 0 - 40 mg/dL   LDL Cholesterol 81 0 - 99 mg/dL    Comment:        Total Cholesterol/HDL:CHD Risk Coronary Heart Disease Risk Table                     Men   Women  1/2 Average Risk   3.4   3.3  Average Risk       5.0   4.4  2 X Average Risk   9.6   7.1  3 X Average Risk  23.4   11.0        Use the calculated Patient Ratio above and the CHD Risk Table to determine the patient's CHD Risk.        ATP III CLASSIFICATION (LDL):  <100     mg/dL   Optimal  100-129  mg/dL   Near or Above                    Optimal  130-159  mg/dL   Borderline  160-189  mg/dL   High  >190     mg/dL   Very High     Ct Abdomen Pelvis W Contrast  Result Date: 05/31/2016 CLINICAL DATA:  65 year old female with abdominal pain and vomiting. EXAM: CT ABDOMEN AND PELVIS WITH CONTRAST TECHNIQUE: Multidetector CT imaging of the abdomen and pelvis was performed using the standard protocol following bolus administration of intravenous contrast. CONTRAST:  80 cc Isovue-300 COMPARISON:  Abdominal CT dated 11/02/2014 FINDINGS: Lower chest: The visualized lung bases are clear. No intra-abdominal free air. Small free fluid in the upper abdomen tracking along the duodenum and inferior to the porta hepaticus. Small perihepatic free fluid. Hepatobiliary: The liver appears unremarkable. No intrahepatic biliary ductal dilatation. The gallbladder is mildly distended. There is inflammatory changes of the gallbladder with a small pericholecystic fluid. No definite calcifying gallstone identified. Pancreas: There is inflammatory changes of the pancreas compatible with acute pancreatitis. There is no fluid collection or abscess or pseudocyst. Findings may be related to underlying gallbladder disease and gallstone pancreatitis. No definite stone identified in the CBD on the CT. MRI/MRCP may provide better evaluation. Spleen: Normal in size without focal abnormality.  Adrenals/Urinary Tract: Adrenal glands are unremarkable. Kidneys are normal, without renal calculi, focal lesion, or hydronephrosis. Bladder is unremarkable. Stomach/Bowel: There is extensive sigmoid diverticulosis without active inflammatory changes. No evidence of bowel obstruction. Small hiatal hernia. There is inflammatory changes of the duodenal C-loop secondary to inflammatory changes of the pancreas. Normal appendix Vascular/Lymphatic: The abdominal aorta and IVC appear unremarkable. No portal venous gas identified. The splenic vein, SMV, and main portal vein are patent. There is no adenopathy. Reproductive: Hysterectomy. Other: Small fat containing umbilical hernia. Musculoskeletal: L2 inferior endplate Schmorl nodes. The osseous structures are otherwise intact. IMPRESSION: Acute pancreatitis with inflammatory changes of the gallbladder. Findings may represent gallstone pancreatitis. No definite stone identified within the CBD. MRI/ MRCP  may provide better evaluation of the pancreas and the gallbladder. Diverticulosis.  No evidence of bowel obstruction.  Normal appendix. Electronically Signed   By: Anner Crete M.D.   On: 05/31/2016 03:11   US Abdomen Limited Ruq  Result Date: 05/31/2016 CLINICAL DATA:  History of pancreatitis. Onset of mid and right upper quadrant abdominal pain last evening. EXAM: US ABDOMEN LIMITED - RIGHT UPPER QUADRANT COMPARISON:  Abdominal pelvic CT scan of today's date FINDINGS: Gallbladder: The gallbladder is adequately distended. Stones and polyps are visible. The gallstone measures 5.6 mm in diameter. The polyp measures 2.1 mm in diameter. There is pericholecystic fluid. The gallbladder wall is top normal at 2.8 mm. No sonographic Murphy's sign could be assessed due to the patient's medicated status. Common bile duct: Diameter: 7.4 mm.  No definite intraluminal stones are observed. Liver: The hepatic echotexture is increased. There is no focal mass nor ductal dilation.  IMPRESSION: Gallstones. Mild pericholecystic fluid with top-normal gallbladder wall thickness it. No definite sonographic Murphy's sign but the patient has had pain medication. Mild dilation of the common bile duct without definite common bile duct stones. Fatty infiltrative change of the liver. Electronically Signed   By: David  Martinique M.D.   On: 05/31/2016 10:15    ROS negative except above Blood pressure 139/65, pulse 88, temperature 98.9 F (37.2 C), temperature source Oral, resp. rate 15, height '5\' 4"'  (1.626 m), weight 74.1 kg (163 lb 5.8 oz), SpO2 100 %. Physical Exam vital signs stable afebrile no acute distress exam pertinent for her abdomen being soft occasional bowel sounds very minimal upper discomfort only no guarding or rebound labs and CT and ultrasound reviewed  Assessment/Plan: Gallstone pancreatitis Plan: I would consult general surgery and if better tomorrow consider laparoscopic cholecystectomy with Intra-Op cholangiogram however if surgery feels more comfortable can get an MRCP first and we'll be on standby in case ERCP is needed and await a.m. labs and we'll allow clear liquids for now but she was instructed to return to nothing by mouth if liquids seem to make her worse  Coleson Kant E 05/31/2016, 12:42 PM

## 2016-05-31 NOTE — ED Notes (Signed)
Atempted to call report

## 2016-05-31 NOTE — ED Provider Notes (Signed)
[facility] DEPT Provider Note   CSN: [id number] Arrival date & time: 05/30/16  2322 By signing my name below, I, Dyke Brackett, attest that this documentation has been prepared under the direction and in the presence of Quintella Reichert, MD . Electronically Signed: Dyke Brackett, Scribe. 05/31/2016. 12:46 AM.   History   Chief Complaint Chief Complaint  Patient presents with  . Abdominal Pain    HPI [name] is a 65 y.o. female with hx of diverticulosis and IBS who presents to the Emergency Department complaining of sudden onset, waxing and waning upper abdominal pain which began at 7:30 tonight. Pt describes her pain as cramping and states the pain has begun to radiate into her lower abdomen. Pt notes associated emesis, constipation, and diarrhea onset this evening. Per pt, she couldn't sleep last night and did not feel well this morning. This pain is not new. She has been been seen for this before In 2004, she had a partial bowel obstruction and states this feels similar. Pt denies fever and dysuria. She was on an abx last week.  The history is provided by the patient. No language interpreter was used.   Past Medical History:  Diagnosis Date  . Allergic rhinitis   . Anxiety   . Depression   . Diverticulosis    HEMMORHOIDS ON COLONOSCOPY  . IBS (irritable bowel syndrome)    DIARRHEA PRONE  . OA (osteoarthritis)    HANDS  . Skin cancer, basal cell    HX skin cancer per patient  . TMJ syndrome   . Vitamin D deficiency     There are no active problems to display for this patient.   Past Surgical History:  Procedure Laterality Date  . BONE SPURS REMOVAL     TOES  . BREAST BIOPSY     RIGHT  . BREAST ENHANCEMENT SURGERY     THEN REMOVED  . FOOT SURGERY     Right  . SHOULDER SURGERY     RIGHT  . TUBAL LIGATION    . VESICOVAGINAL FISTULA CLOSURE W/ TAH     WITHOUT OOPHS    OB History    No data available       Home Medications    Prior to Admission  medications   Medication Sig Start Date End Date Taking? Authorizing Provider  Cholecalciferol (VITAMIN D) 2000 UNITS CAPS Take 5,000 Units by mouth daily.    Yes Historical Provider, MD  ibuprofen (ADVIL,MOTRIN) 200 MG tablet Take 600-800 mg by mouth every 6 (six) hours as needed for moderate pain.    Yes Historical Provider, MD  ondansetron (ZOFRAN ODT) 4 MG disintegrating tablet 4mg  ODT q4 hours prn nausea/vomit 10/31/14  Yes Milton Ferguson, MD  Polyvinyl Alcohol-Povidone (REFRESH OP) Apply 1-2 drops to eye daily as needed (dry eyes).   Yes Historical Provider, MD    Family History Family History  Problem Relation Age of Onset  . Hypertension Mother   . Cancer - Other Mother   . Lung cancer Father   . Cancer - Other Brother   . CVA Brother   . Heart attack Brother   . AAA (abdominal aortic aneurysm) Sister   . Depression Sister     Social History Social History  Substance Use Topics  . Smoking status: Never Smoker  . Smokeless tobacco: Never Used  . Alcohol use No    Allergies   Ketorolac tromethamine; Morphine and related; Percocet [oxycodone-acetaminophen]; and Zoloft [sertraline hcl]   Review of Systems  Review of Systems 10 systems reviewed and all are negative for acute change except as noted in the HPI.  Physical Exam Updated Vital Signs BP 140/64   Pulse 92   Temp 98.6 F (37 C) (Oral)   Resp 12   Ht 5\' 4"  (1.626 m)   Wt 157 lb (71.2 kg)   SpO2 99%   BMI 26.95 kg/m   Physical Exam  Constitutional: She is oriented to person, place, and time. She appears well-developed and well-nourished.  HENT:  Head: Normocephalic and atraumatic.  Cardiovascular: Normal rate and regular rhythm.   No murmur heard. Pulmonary/Chest: Effort normal and breath sounds normal. No respiratory distress.  Abdominal: Soft. There is tenderness. There is no rebound and no guarding.  Moderate epigastric tenderness   Musculoskeletal: She exhibits edema (trace pitting edema). She  exhibits no tenderness.  Neurological: She is alert and oriented to person, place, and time.  Skin: Skin is warm and dry.  Psychiatric: She has a normal mood and affect. Her behavior is normal.  Nursing note and vitals reviewed.  ED Treatments / Results  DIAGNOSTIC STUDIES:  Oxygen Saturation is 100% on RA, normal by my interpretation.    COORDINATION OF CARE:  12:49 AM Will order urinalysis, lipase, and CMP. Discussed treatment plan which includes dilaudid and Zofran with pt at bedside and pt agreed to plan.   Labs (all labs ordered are listed, but only abnormal results are displayed) Labs Reviewed  LIPASE, BLOOD - Abnormal; Notable for the following:       Result Value   Lipase 1,994 (*)    All other components within normal limits  COMPREHENSIVE METABOLIC PANEL - Abnormal; Notable for the following:    Glucose, Bld 182 (*)    AST 55 (*)    All other components within normal limits  CBC - Abnormal; Notable for the following:    WBC 14.9 (*)    All other components within normal limits  URINALYSIS, ROUTINE W REFLEX MICROSCOPIC (NOT AT [facility]) - Abnormal; Notable for the following:    APPearance CLOUDY (*)    Hgb urine dipstick SMALL (*)    Ketones, ur 15 (*)    All other components within normal limits  URINE MICROSCOPIC-ADD ON - Abnormal; Notable for the following:    Squamous Epithelial / LPF 0-5 (*)    Bacteria, UA FEW (*)    Crystals CA OXALATE CRYSTALS (*)    All other components within normal limits    EKG  EKG Interpretation None       Radiology Ct Abdomen Pelvis W Contrast  Result Date: 05/31/2016 CLINICAL DATA:  65 year old female with abdominal pain and vomiting. EXAM: CT ABDOMEN AND PELVIS WITH CONTRAST TECHNIQUE: Multidetector CT imaging of the abdomen and pelvis was performed using the standard protocol following bolus administration of intravenous contrast. CONTRAST:  80 cc Isovue-300 COMPARISON:  Abdominal CT dated 11/02/2014 FINDINGS: Lower chest: The  visualized lung bases are clear. No intra-abdominal free air. Small free fluid in the upper abdomen tracking along the duodenum and inferior to the porta hepaticus. Small perihepatic free fluid. Hepatobiliary: The liver appears unremarkable. No intrahepatic biliary ductal dilatation. The gallbladder is mildly distended. There is inflammatory changes of the gallbladder with a small pericholecystic fluid. No definite calcifying gallstone identified. Pancreas: There is inflammatory changes of the pancreas compatible with acute pancreatitis. There is no fluid collection or abscess or pseudocyst. Findings may be related to underlying gallbladder disease and gallstone pancreatitis. No definite stone identified  in the CBD on the CT. MRI/MRCP may provide better evaluation. Spleen: Normal in size without focal abnormality. Adrenals/Urinary Tract: Adrenal glands are unremarkable. Kidneys are normal, without renal calculi, focal lesion, or hydronephrosis. Bladder is unremarkable. Stomach/Bowel: There is extensive sigmoid diverticulosis without active inflammatory changes. No evidence of bowel obstruction. Small hiatal hernia. There is inflammatory changes of the duodenal C-loop secondary to inflammatory changes of the pancreas. Normal appendix Vascular/Lymphatic: The abdominal aorta and IVC appear unremarkable. No portal venous gas identified. The splenic vein, SMV, and main portal vein are patent. There is no adenopathy. Reproductive: Hysterectomy. Other: Small fat containing umbilical hernia. Musculoskeletal: L2 inferior endplate Schmorl nodes. The osseous structures are otherwise intact. IMPRESSION: Acute pancreatitis with inflammatory changes of the gallbladder. Findings may represent gallstone pancreatitis. No definite stone identified within the CBD. MRI/ MRCP may provide better evaluation of the pancreas and the gallbladder. Diverticulosis.  No evidence of bowel obstruction.  Normal appendix. Electronically Signed   By:  Anner Crete M.D.   On: 05/31/2016 03:11    Procedures Procedures (including critical care time)  Medications Ordered in ED Medications  iopamidol (ISOVUE-300) 61 % injection (not administered)  HYDROmorphone (DILAUDID) injection 1 mg (1 mg Intravenous Given 05/31/16 0057)  ondansetron (ZOFRAN) injection 4 mg (4 mg Intravenous Given 05/31/16 0057)  sodium chloride 0.9 % bolus 1,000 mL (1,000 mLs Intravenous New Bag/Given 05/31/16 0057)  ondansetron (ZOFRAN) injection 4 mg (4 mg Intravenous Given 05/31/16 0154)     Initial Impression / Assessment and Plan / ED Course  I have reviewed the triage vital signs and the nursing notes.  Pertinent labs & imaging results that were available during my care of the patient were reviewed by me and considered in my medical decision making (see chart for details).  Clinical Course    Patient here for evaluation of abdominal pain. She has significant epigastric tenderness on examination. CT and labs are consistent with acute pancreatitis. Plan to admit for further management. Patient updated of findings a studies and recommendation for admission and she is in agreement with plan.  Final Clinical Impressions(s) / ED Diagnoses   Final diagnoses:  Acute pancreatitis, unspecified complication status, unspecified pancreatitis type    New Prescriptions New Prescriptions   No medications on file  I personally performed the services described in this documentation, which was scribed in my presence. The recorded information has been reviewed and is accurate.    Quintella Reichert, MD 05/31/16 [phone]

## 2016-05-31 NOTE — H&P (Signed)
History and Physical    [name] [id number] DOB: [birth year] DOA: 05/31/2016  Referring MD/NP/PA: Dr. Ayesha Rumpf PCP: Vidal Schwalbe, MD  Patient coming from: Home  Chief Complaint: Epigastric pain  HPI: BRANDILEE HANKO is a 65 y.o. female with medical history significant of SBO, IBS, diverticulosis, and arthritis; who presents with a one-day history of epigastric pain. Patient states that she did not feel well yesterday. Around 7:30 PM last night she reported having acute onset of a crampy and sharp epigastric abdominal pain. Symptoms waxed and waned in intensity. Tried Advil without relief of symptoms. Associated symptoms included nausea, vomiting. Symptoms worsened with any movements. Symptoms feel similar to her previous partial bowel obstruction back in 2004. Denies fever, dysuria, shortness of breath, blood in stool/urine, or focal weakness. She notes that she does not drink alcohol and rarely takes medications. Occasionally taking NSAID here and there for arthritis pain.  ED Course: Upon admission into the emergency department patient was seen to be afebrile with pulse of 118, followed vitals relatively within normal limits. I work revealed WBC 14.9, glucose 182, lipase 1994, AST 55, and ALT 34. CT scan of the abdomen showed acute pancreatitis with inflammatory changes of the gallbladder question need of MRI/MRCP.  Review of Systems: As per HPI otherwise 10 point review of systems negative.   Past Medical History:  Diagnosis Date  . Allergic rhinitis   . Anxiety   . Depression   . Diverticulosis    HEMMORHOIDS ON COLONOSCOPY  . IBS (irritable bowel syndrome)    DIARRHEA PRONE  . OA (osteoarthritis)    HANDS  . Skin cancer, basal cell    HX skin cancer per patient  . TMJ syndrome   . Vitamin D deficiency     Past Surgical History:  Procedure Laterality Date  . BONE SPURS REMOVAL     TOES  . BREAST BIOPSY     RIGHT  . BREAST ENHANCEMENT SURGERY     THEN REMOVED    . FOOT SURGERY     Right  . SHOULDER SURGERY     RIGHT  . TUBAL LIGATION    . VESICOVAGINAL FISTULA CLOSURE W/ TAH     WITHOUT OOPHS     reports that she has never smoked. She has never used smokeless tobacco. She reports that she does not drink alcohol or use drugs.  Allergies  Allergen Reactions  . Ketorolac Tromethamine Nausea And Vomiting  . Morphine And Related Other (See Comments)    Jumpy   . Percocet [Oxycodone-Acetaminophen] Nausea And Vomiting  . Zoloft [Sertraline Hcl]     DIARRHEA     Family History  Problem Relation Age of Onset  . Hypertension Mother   . Cancer - Other Mother   . Lung cancer Father   . Cancer - Other Brother   . CVA Brother   . Heart attack Brother   . AAA (abdominal aortic aneurysm) Sister   . Depression Sister     Prior to Admission medications   Medication Sig Start Date End Date Taking? Authorizing Provider  Cholecalciferol (VITAMIN D) 2000 UNITS CAPS Take 5,000 Units by mouth daily.    Yes Historical Provider, MD  ibuprofen (ADVIL,MOTRIN) 200 MG tablet Take 600-800 mg by mouth every 6 (six) hours as needed for moderate pain.    Yes Historical Provider, MD  ondansetron (ZOFRAN ODT) 4 MG disintegrating tablet 4mg  ODT q4 hours prn nausea/vomit 10/31/14  Yes Milton Ferguson, MD  Polyvinyl Alcohol-Povidone ([place] OP)  Apply 1-2 drops to eye daily as needed (dry eyes).   Yes Historical Provider, MD    Physical Exam:  Constitutional: Elderly female who appears in mild discomfort . Vitals:   05/31/16 0345 05/31/16 0400 05/31/16 0415 05/31/16 0430  BP: 134/70 149/86 153/87 149/85  Pulse: 93 108 94 94  Resp: 16 19 10 12   Temp:      TempSrc:      SpO2: 100% 100% 99% 98%  Weight:      Height:       Eyes: PERRL, lids and conjunctivae normal ENMT: Mucous membranes are dry. Posterior pharynx clear of any exudate or lesions.Normal dentition.  Neck: normal, supple, no masses, no thyromegaly Respiratory: clear to auscultation bilaterally, no  wheezing, no crackles. Normal respiratory effort. No accessory muscle use.  Cardiovascular: Regular rate and rhythm, no murmurs / rubs / gallops. No extremity edema. 2+ pedal pulses. No carotid bruits.  Abdomen: Tenderness to palpation of the epigastric region, no masses palpated. No hepatosplenomegaly. Bowel sounds positive.  Musculoskeletal: no clubbing / cyanosis. No joint deformity upper and lower extremities. Good ROM, no contractures. Normal muscle tone.  Skin: no rashes, lesions, ulcers. No induration Neurologic: CN 2-12 grossly intact. Sensation intact, DTR normal. Strength 5/5 in all 4.  Psychiatric: Normal judgment and insight. Alert and oriented x 3. Normal mood.     Labs on Admission: I have personally reviewed following labs and imaging studies  CBC:  Recent Labs Lab 05/30/16 2336  WBC 14.9*  HGB 14.5  HCT 44.3  MCV 87.0  PLT [id number]   Basic Metabolic Panel:  Recent Labs Lab 05/30/16 2336  NA 139  K 3.5  CL 106  CO2 22  GLUCOSE 182*  BUN 8  CREATININE 0.77  CALCIUM 10.1   GFR: Estimated Creatinine Clearance: 66.9 mL/min (by C-G formula based on SCr of 0.77 mg/dL). Liver Function Tests:  Recent Labs Lab 05/30/16 2336  AST 55*  ALT 37  ALKPHOS 101  BILITOT 0.7  PROT 6.6  ALBUMIN 4.2    Recent Labs Lab 05/30/16 2336  LIPASE 1,994*   No results for input(s): AMMONIA in the last 168 hours. Coagulation Profile: No results for input(s): INR, PROTIME in the last 168 hours. Cardiac Enzymes: No results for input(s): CKTOTAL, CKMB, CKMBINDEX, TROPONINI in the last 168 hours. BNP (last 3 results) No results for input(s): PROBNP in the last 8760 hours. HbA1C: No results for input(s): HGBA1C in the last 72 hours. CBG: No results for input(s): GLUCAP in the last 168 hours. Lipid Profile: No results for input(s): CHOL, HDL, LDLCALC, TRIG, CHOLHDL, LDLDIRECT in the last 72 hours. Thyroid Function Tests: No results for input(s): TSH, T4TOTAL, FREET4,  T3FREE, THYROIDAB in the last 72 hours. Anemia Panel: No results for input(s): VITAMINB12, FOLATE, FERRITIN, TIBC, IRON, RETICCTPCT in the last 72 hours. Urine analysis:    Component Value Date/Time   COLORURINE YELLOW 05/30/2016 2342   APPEARANCEUR CLOUDY (A) 05/30/2016 2342   LABSPEC 1.025 05/30/2016 2342   PHURINE 5.5 05/30/2016 2342   GLUCOSEU NEGATIVE 05/30/2016 2342   HGBUR SMALL (A) 05/30/2016 2342   BILIRUBINUR NEGATIVE 05/30/2016 2342   KETONESUR 15 (A) 05/30/2016 2342   PROTEINUR NEGATIVE 05/30/2016 2342   UROBILINOGEN 0.2 11/02/2014 0553   NITRITE NEGATIVE 05/30/2016 2342   LEUKOCYTESUR NEGATIVE 05/30/2016 2342   Sepsis Labs: No results found for this or any previous visit (from the past 240 hour(s)).   Radiological Exams on Admission: Ct Abdomen Pelvis W Contrast  Result Date: 05/31/2016 CLINICAL DATA:  65 year old female with abdominal pain and vomiting. EXAM: CT ABDOMEN AND PELVIS WITH CONTRAST TECHNIQUE: Multidetector CT imaging of the abdomen and pelvis was performed using the standard protocol following bolus administration of intravenous contrast. CONTRAST:  80 cc Isovue-300 COMPARISON:  Abdominal CT dated 11/02/2014 FINDINGS: Lower chest: The visualized lung bases are clear. No intra-abdominal free air. Small free fluid in the upper abdomen tracking along the duodenum and inferior to the porta hepaticus. Small perihepatic free fluid. Hepatobiliary: The liver appears unremarkable. No intrahepatic biliary ductal dilatation. The gallbladder is mildly distended. There is inflammatory changes of the gallbladder with a small pericholecystic fluid. No definite calcifying gallstone identified. Pancreas: There is inflammatory changes of the pancreas compatible with acute pancreatitis. There is no fluid collection or abscess or pseudocyst. Findings may be related to underlying gallbladder disease and gallstone pancreatitis. No definite stone identified in the CBD on the CT. MRI/MRCP  may provide better evaluation. Spleen: Normal in size without focal abnormality. Adrenals/Urinary Tract: Adrenal glands are unremarkable. Kidneys are normal, without renal calculi, focal lesion, or hydronephrosis. Bladder is unremarkable. Stomach/Bowel: There is extensive sigmoid diverticulosis without active inflammatory changes. No evidence of bowel obstruction. Small hiatal hernia. There is inflammatory changes of the duodenal C-loop secondary to inflammatory changes of the pancreas. Normal appendix Vascular/Lymphatic: The abdominal aorta and IVC appear unremarkable. No portal venous gas identified. The splenic vein, SMV, and main portal vein are patent. There is no adenopathy. Reproductive: Hysterectomy. Other: Small fat containing umbilical hernia. Musculoskeletal: L2 inferior endplate Schmorl nodes. The osseous structures are otherwise intact. IMPRESSION: Acute pancreatitis with inflammatory changes of the gallbladder. Findings may represent gallstone pancreatitis. No definite stone identified within the CBD. MRI/ MRCP may provide better evaluation of the pancreas and the gallbladder. Diverticulosis.  No evidence of bowel obstruction.  Normal appendix. Electronically Signed   By: Anner Crete M.D.   On: 05/31/2016 03:11      Assessment/Plan Acute pancreatitis: Patient with epigastric pain started yesterday evening. Lipase elevated at 1944. CT abdomen shows inflammation around the pancreas as well as the gallbladder. Question if cause possibly secondary to gallstones. - Admit to MedSurg bed - NPO - Dilaudid IV prn pain - IV fluids of NS at 125 ml/hr - Will need to consult gastroenterology in am to determine if MRI/MRCP is needed  Leukocytosis: Acute. WBC elevated at 14.9. suspect inflammatory and not infectious. not - Repeat CBC in a.m.  Nausea and vomiting - Zofran/Phenergan prn N/V  Hyperglycemia: Blood glucose elevated to 182 on admission  -Continue to monitor and started insulin  regimen, if needed    DVT prophylaxis: SCDs  Code Status: Full Family Communication: Discuss plan with the patient and husband present at bedside  Disposition Plan: Discharge home once medically stable Consults called: none Admission status:  Inpatient  Norval Morton MD [facility] Pager [phone]  If 7PM-7AM, please contact night-coverage [email or web address] Password [email or web address]  05/31/2016, 4:50 AM

## 2016-05-31 NOTE — Consult Note (Signed)
[facility] Surgery Consult Note  [name] 08-27-1951  [id number].    Requesting MD: Eulogio Bear Chief Complaint/Reason for Consult: Abdominal pain  HPI:  [name] is a 65yo female who presented to [facility] late last night with acute onset of epigastric pain. This pain was constant and severe. She also reports nausea and vomiting. Denies dysuria, melena, hematochezia, and fevers. States that she had a similar pain about 1 month ago, but it resolved after about 1 hour with no intervention. States that she was treated for UTI last week with nitrofurantoin. Last meal clear liquids today Last BM today  ED workup: - CT: Acute pancreatitis with inflammatory changes of the gallbladder. No definite stone identified within the  CBD. Diverticulosis. - RUQ u/s: Gallstones. Mild pericholecystic fluid with top-normal gallbladder wall thickness it. Mild dilation of the common bile duct without definite common bile duct stones. Fatty infiltrative change of the liver. - lipase 1994 - AST/ALT/alk phose/bili 55/37/101/0.7 - WBC 14.9  PMH significant for IBS, diverticulosis, SBO treated medically in 2004 Abdominal surgical history partial hysterectomy Denies home use of anticoagulants Nonsmoker Denies alcohol use Employment: retired  ROS: All systems reviewed and otherwise negative except for as above  Family History  Problem Relation Age of Onset  . Hypertension Mother   . Cancer - Other Mother   . Lung cancer Father   . Cancer - Other Brother   . CVA Brother   . Heart attack Brother   . AAA (abdominal aortic aneurysm) Sister   . Depression Sister     Past Medical History:  Diagnosis Date  . Allergic rhinitis   . Anxiety   . Depression   . Diverticulosis    HEMMORHOIDS ON COLONOSCOPY  . IBS (irritable bowel syndrome)    DIARRHEA PRONE  . OA (osteoarthritis)    HANDS  . Skin cancer, basal cell    HX skin cancer per patient  . TMJ syndrome   . Vitamin D  deficiency     Past Surgical History:  Procedure Laterality Date  . BONE SPURS REMOVAL     TOES  . BREAST BIOPSY     RIGHT  . BREAST ENHANCEMENT SURGERY     THEN REMOVED  . FOOT SURGERY     Right  . SHOULDER SURGERY     RIGHT  . TUBAL LIGATION    . VESICOVAGINAL FISTULA CLOSURE W/ TAH     WITHOUT OOPHS    Social History:  reports that she has never smoked. She has never used smokeless tobacco. She reports that she does not drink alcohol or use drugs.  Allergies:  Allergies  Allergen Reactions  . Ketorolac Tromethamine Nausea And Vomiting  . Morphine And Related Other (See Comments)    Jumpy   . Percocet [Oxycodone-Acetaminophen] Nausea And Vomiting  . Zoloft [Sertraline Hcl]     DIARRHEA     Medications Prior to Admission  Medication Sig Dispense Refill  . Cholecalciferol (VITAMIN D) 2000 UNITS CAPS Take 5,000 Units by mouth daily.     Marland Kitchen ibuprofen (ADVIL,MOTRIN) 200 MG tablet Take 600-800 mg by mouth every 6 (six) hours as needed for moderate pain.     Marland Kitchen ondansetron (ZOFRAN ODT) 4 MG disintegrating tablet 90m ODT q4 hours prn nausea/vomit 12 tablet 0  . Polyvinyl Alcohol-Povidone ([place] OP) Apply 1-2 drops to eye daily as needed (dry eyes).      Blood pressure (!) 116/58, pulse 77, temperature 98.3 F (36.8 C), temperature source Oral,  resp. rate 15, height _0  (1.626 m), weight 163 lb 5.8 oz (74.1 kg), SpO2 98 %. Physical Exam: General: pleasant, WD/WN white female who is laying in bed in NAD HEENT: head is normocephalic, atraumatic.   Heart: regular, rate, and rhythm.  No obvious murmurs, gallops, or rubs noted.  Palpable pedal pulses bilaterally Lungs: CTAB, no wheezes, rhonchi, or rales noted.  Respiratory effort nonlabored Abd: lower well healed incision from partial hysterectomy, soft, ND, +BS, no masses, hernias, or organomegaly. +BS. Mild tenderness RUQ and epigastrium MS: all 4 extremities are symmetrical with no cyanosis, clubbing, or edema. Skin: warm  and dry with no masses, lesions, or rashes Psych: A&Ox3 with an appropriate affect.  Results for orders placed or performed during the hospital encounter of 05/31/16 (from the past 48 hour(s))  Lipase, blood     Status: Abnormal   Collection Time: 05/30/16 11:36 PM  Result Value Ref Range   Lipase 1,994 (H) 11 - 51 U/L    Comment: RESULTS CONFIRMED BY MANUAL DILUTION  Comprehensive metabolic panel     Status: Abnormal   Collection Time: 05/30/16 11:36 PM  Result Value Ref Range   Sodium 139 135 - 145 mmol/L   Potassium 3.5 3.5 - 5.1 mmol/L   Chloride 106 101 - 111 mmol/L   CO2 22 22 - 32 mmol/L   Glucose, Bld 182 (H) 65 - 99 mg/dL   BUN 8 6 - 20 mg/dL   Creatinine, Ser 0.77 0.44 - 1.00 mg/dL   Calcium 10.1 8.9 - 10.3 mg/dL   Total Protein 6.6 6.5 - 8.1 g/dL   Albumin 4.2 3.5 - 5.0 g/dL   AST 55 (H) 15 - 41 U/L   ALT 37 14 - 54 U/L   Alkaline Phosphatase 101 38 - 126 U/L   Total Bilirubin 0.7 0.3 - 1.2 mg/dL   GFR calc non Af Amer >60 >60 mL/min   GFR calc Af Amer >60 >60 mL/min    Comment: (NOTE) The eGFR has been calculated using the CKD EPI equation. This calculation has not been validated in all clinical situations. eGFR's persistently <60 mL/min signify possible Chronic Kidney Disease.    Anion gap 11 5 - 15  CBC     Status: Abnormal   Collection Time: 05/30/16 11:36 PM  Result Value Ref Range   WBC 14.9 (H) 4.0 - 10.5 K/uL   RBC 5.09 3.87 - 5.11 MIL/uL   Hemoglobin 14.5 12.0 - 15.0 g/dL   HCT 44.3 36.0 - 46.0 %   MCV 87.0 78.0 - 100.0 fL   MCH 28.5 26.0 - 34.0 pg   MCHC 32.7 30.0 - 36.0 g/dL   RDW 13.8 11.5 - 15.5 %   Platelets 300 150 - 400 K/uL  Urinalysis, Routine w reflex microscopic     Status: Abnormal   Collection Time: 05/30/16 11:42 PM  Result Value Ref Range   Color, Urine YELLOW YELLOW   APPearance CLOUDY (A) CLEAR   Specific Gravity, Urine 1.025 1.005 - 1.030   pH 5.5 5.0 - 8.0   Glucose, UA NEGATIVE NEGATIVE mg/dL   Hgb urine dipstick SMALL (A)  NEGATIVE   Bilirubin Urine NEGATIVE NEGATIVE   Ketones, ur 15 (A) NEGATIVE mg/dL   Protein, ur NEGATIVE NEGATIVE mg/dL   Nitrite NEGATIVE NEGATIVE   Leukocytes, UA NEGATIVE NEGATIVE  Urine microscopic-add on     Status: Abnormal   Collection Time: 05/30/16 11:42 PM  Result Value Ref Range   Squamous Epithelial /  LPF 0-5 (A) NONE SEEN   WBC, UA 0-5 0 - 5 WBC/hpf   RBC / HPF 0-5 0 - 5 RBC/hpf   Bacteria, UA FEW (A) NONE SEEN   Crystals CA OXALATE CRYSTALS (A) NEGATIVE   Urine-Other YEAST PRESENT     Comment: MUCOUS PRESENT  CBC     Status: Abnormal   Collection Time: 05/31/16  5:06 AM  Result Value Ref Range   WBC 12.2 (H) 4.0 - 10.5 K/uL   RBC 4.68 3.87 - 5.11 MIL/uL   Hemoglobin 13.2 12.0 - 15.0 g/dL   HCT 40.9 36.0 - 46.0 %   MCV 87.4 78.0 - 100.0 fL   MCH 28.2 26.0 - 34.0 pg   MCHC 32.3 30.0 - 36.0 g/dL   RDW 13.8 11.5 - 15.5 %   Platelets 258 150 - 400 K/uL  Comprehensive metabolic panel     Status: Abnormal   Collection Time: 05/31/16  5:06 AM  Result Value Ref Range   Sodium 137 135 - 145 mmol/L   Potassium 3.3 (L) 3.5 - 5.1 mmol/L   Chloride 107 101 - 111 mmol/L   CO2 22 22 - 32 mmol/L   Glucose, Bld 160 (H) 65 - 99 mg/dL   BUN 5 (L) 6 - 20 mg/dL   Creatinine, Ser 0.59 0.44 - 1.00 mg/dL   Calcium 8.6 (L) 8.9 - 10.3 mg/dL   Total Protein 5.8 (L) 6.5 - 8.1 g/dL   Albumin 3.7 3.5 - 5.0 g/dL   AST 47 (H) 15 - 41 U/L   ALT 35 14 - 54 U/L   Alkaline Phosphatase 83 38 - 126 U/L   Total Bilirubin 0.8 0.3 - 1.2 mg/dL   GFR calc non Af Amer >60 >60 mL/min   GFR calc Af Amer >60 >60 mL/min    Comment: (NOTE) The eGFR has been calculated using the CKD EPI equation. This calculation has not been validated in all clinical situations. eGFR's persistently <60 mL/min signify possible Chronic Kidney Disease.    Anion gap 8 5 - 15  Lipid panel     Status: None   Collection Time: 05/31/16  5:06 AM  Result Value Ref Range   Cholesterol 143 0 - 200 mg/dL   Triglycerides 91  <150 mg/dL   HDL 44 >40 mg/dL   Total CHOL/HDL Ratio 3.3 RATIO   VLDL 18 0 - 40 mg/dL   LDL Cholesterol 81 0 - 99 mg/dL    Comment:        Total Cholesterol/HDL:CHD Risk Coronary Heart Disease Risk Table                     Men   Women  1/2 Average Risk   3.4   3.3  Average Risk       5.0   4.4  2 X Average Risk   9.6   7.1  3 X Average Risk  23.4   11.0        Use the calculated Patient Ratio above and the CHD Risk Table to determine the patient's CHD Risk.        ATP III CLASSIFICATION (LDL):  <100     mg/dL   Optimal  100-129  mg/dL   Near or Above                    Optimal  130-159  mg/dL   Borderline  160-189  mg/dL   High  >190  mg/dL   Very High    Ct Abdomen Pelvis W Contrast  Result Date: 05/31/2016 CLINICAL DATA:  65 year old female with abdominal pain and vomiting. EXAM: CT ABDOMEN AND PELVIS WITH CONTRAST TECHNIQUE: Multidetector CT imaging of the abdomen and pelvis was performed using the standard protocol following bolus administration of intravenous contrast. CONTRAST:  80 cc Isovue-300 COMPARISON:  Abdominal CT dated 11/02/2014 FINDINGS: Lower chest: The visualized lung bases are clear. No intra-abdominal free air. Small free fluid in the upper abdomen tracking along the duodenum and inferior to the porta hepaticus. Small perihepatic free fluid. Hepatobiliary: The liver appears unremarkable. No intrahepatic biliary ductal dilatation. The gallbladder is mildly distended. There is inflammatory changes of the gallbladder with a small pericholecystic fluid. No definite calcifying gallstone identified. Pancreas: There is inflammatory changes of the pancreas compatible with acute pancreatitis. There is no fluid collection or abscess or pseudocyst. Findings may be related to underlying gallbladder disease and gallstone pancreatitis. No definite stone identified in the CBD on the CT. MRI/MRCP may provide better evaluation. Spleen: Normal in size without focal abnormality.  Adrenals/Urinary Tract: Adrenal glands are unremarkable. Kidneys are normal, without renal calculi, focal lesion, or hydronephrosis. Bladder is unremarkable. Stomach/Bowel: There is extensive sigmoid diverticulosis without active inflammatory changes. No evidence of bowel obstruction. Small hiatal hernia. There is inflammatory changes of the duodenal C-loop secondary to inflammatory changes of the pancreas. Normal appendix Vascular/Lymphatic: The abdominal aorta and IVC appear unremarkable. No portal venous gas identified. The splenic vein, SMV, and main portal vein are patent. There is no adenopathy. Reproductive: Hysterectomy. Other: Small fat containing umbilical hernia. Musculoskeletal: L2 inferior endplate Schmorl nodes. The osseous structures are otherwise intact. IMPRESSION: Acute pancreatitis with inflammatory changes of the gallbladder. Findings may represent gallstone pancreatitis. No definite stone identified within the CBD. MRI/ MRCP may provide better evaluation of the pancreas and the gallbladder. Diverticulosis.  No evidence of bowel obstruction.  Normal appendix. Electronically Signed   By: Anner Crete M.D.   On: 05/31/2016 03:11   US Abdomen Limited Ruq  Result Date: 05/31/2016 CLINICAL DATA:  History of pancreatitis. Onset of mid and right upper quadrant abdominal pain last evening. EXAM: US ABDOMEN LIMITED - RIGHT UPPER QUADRANT COMPARISON:  Abdominal pelvic CT scan of today's date FINDINGS: Gallbladder: The gallbladder is adequately distended. Stones and polyps are visible. The gallstone measures 5.6 mm in diameter. The polyp measures 2.1 mm in diameter. There is pericholecystic fluid. The gallbladder wall is top normal at 2.8 mm. No sonographic Murphy's sign could be assessed due to the patient's medicated status. Common bile duct: Diameter: 7.4 mm.  No definite intraluminal stones are observed. Liver: The hepatic echotexture is increased. There is no focal mass nor ductal dilation.  IMPRESSION: Gallstones. Mild pericholecystic fluid with top-normal gallbladder wall thickness it. No definite sonographic Murphy's sign but the patient has had pain medication. Mild dilation of the common bile duct without definite common bile duct stones. Fatty infiltrative change of the liver. Electronically Signed   By: David  Martinique M.D.   On: 05/31/2016 10:15       Assessment/Plan Gallstone pancreatitis - abdominal pain improving, mild tenderness RUQ and epigastrium - CT: Acute pancreatitis with inflammatory changes of the gallbladder. No definite stone identified within the  CBD. Diverticulosis. - RUQ u/s: Gallstones. Mild pericholecystic fluid with top-normal gallbladder wall thickness it. Mild dilation of the common bile duct without definite common bile duct stones. Fatty infiltrative change of the liver. - lipase 1994 - AST/ALT/alk phose/bili  55/37/101/0.7 - WBC 14.9  ID - rocephin 10/2>> VTE - SCDs FEN - clears/IVF, NPO after midnight  Plan - recommend repeating labs in the morning (lipase, CMP, CBC). will plan for laparoscopic cholecystectomy with IOC in the morning if labs are normal. NPO after midnight. Continue antibiotics.  Jerrye Beavers, [facility] Surgery 05/31/2016, 2:24 PM Pager: [phone] Consults: [phone] Mon-Fri 7:00 am-4:30 pm Sat-Sun 7:00 am-11:30 am

## 2016-05-31 NOTE — Progress Notes (Signed)
Patient admitted after midnight, please see H&P.  Lipase elevated with signs of inflammatory changes in GB-- no liver enzyme elevated.  Will get RUQ U/S and consult GI ( has seen Dr. Delma Officer in the past for diverticulitis) Only recent medication was nitrofurantoin for UTI- which can cause pancreatitis  Joy Bear DO

## 2016-06-01 ENCOUNTER — Encounter (HOSPITAL_COMMUNITY)
Admission: EM | Disposition: A | Discharge status: Home / Self Care | Payer: Self-pay | Source: Home / Self Care | Attending: Internal Medicine

## 2016-06-01 ENCOUNTER — Encounter (HOSPITAL_COMMUNITY): Payer: Self-pay | Admitting: Certified Registered"

## 2016-06-01 ENCOUNTER — Inpatient Hospital Stay (HOSPITAL_COMMUNITY): Payer: PPO | Admitting: Anesthesiology

## 2016-06-01 ENCOUNTER — Inpatient Hospital Stay (HOSPITAL_COMMUNITY): Payer: PPO

## 2016-06-01 DIAGNOSIS — K851 Biliary acute pancreatitis without necrosis or infection: Principal | ICD-10-CM

## 2016-06-01 HISTORY — PX: CHOLECYSTECTOMY: SHX55

## 2016-06-01 LAB — CBC WITH DIFFERENTIAL/PLATELET
BASOS ABS: 0 10*3/uL (ref 0.0–0.1)
Basophils Relative: 0 %
Eosinophils Absolute: 0.1 10*3/uL (ref 0.0–0.7)
Eosinophils Relative: 1 %
HEMATOCRIT: 36.1 % (ref 36.0–46.0)
Hemoglobin: 11.3 g/dL — ABNORMAL LOW (ref 12.0–15.0)
LYMPHS ABS: 2.3 10*3/uL (ref 0.7–4.0)
LYMPHS PCT: 24 %
MCH: 27.8 pg (ref 26.0–34.0)
MCHC: 31.3 g/dL (ref 30.0–36.0)
MCV: 88.9 fL (ref 78.0–100.0)
MONO ABS: 0.8 10*3/uL (ref 0.1–1.0)
MONOS PCT: 8 %
NEUTROS ABS: 6.6 10*3/uL (ref 1.7–7.7)
Neutrophils Relative %: 67 %
PLATELETS: 210 10*3/uL (ref 150–400)
RBC: 4.06 MIL/uL (ref 3.87–5.11)
RDW: 14.1 % (ref 11.5–15.5)
WBC: 9.8 10*3/uL (ref 4.0–10.5)

## 2016-06-01 LAB — COMPREHENSIVE METABOLIC PANEL
ALT: 24 U/L (ref 14–54)
AST: 22 U/L (ref 15–41)
Albumin: 3 g/dL — ABNORMAL LOW (ref 3.5–5.0)
Alkaline Phosphatase: 76 U/L (ref 38–126)
Anion gap: 3 — ABNORMAL LOW (ref 5–15)
BUN: <5 mg/dL — ABNORMAL LOW (ref 6–20)
CHLORIDE: 113 mmol/L — AB (ref 101–111)
CO2: 23 mmol/L (ref 22–32)
CREATININE: 0.57 mg/dL (ref 0.44–1.00)
Calcium: 8.5 mg/dL — ABNORMAL LOW (ref 8.9–10.3)
GFR calc non Af Amer: >60 mL/min (ref >60)
Glucose, Bld: 101 mg/dL — ABNORMAL HIGH (ref 65–99)
POTASSIUM: 3.8 mmol/L (ref 3.5–5.1)
SODIUM: 139 mmol/L (ref 135–145)
Total Bilirubin: 0.7 mg/dL (ref 0.3–1.2)
Total Protein: 5 g/dL — ABNORMAL LOW (ref 6.5–8.1)

## 2016-06-01 LAB — LIPASE, BLOOD: LIPASE: 158 U/L — AB (ref 11–51)

## 2016-06-01 LAB — URINE CULTURE: Culture: NO GROWTH

## 2016-06-01 SURGERY — LAPAROSCOPIC CHOLECYSTECTOMY WITH INTRAOPERATIVE CHOLANGIOGRAM
Anesthesia: General | Site: Abdomen

## 2016-06-01 MED ORDER — DEXAMETHASONE SODIUM PHOSPHATE 10 MG/ML IJ SOLN
INTRAMUSCULAR | Status: AC
  Filled 2016-06-01: qty 1

## 2016-06-01 MED ORDER — SUGAMMADEX SODIUM 200 MG/2ML IV SOLN
INTRAVENOUS | Status: DC | PRN
  Administered 2016-06-01: 200 mg via INTRAVENOUS

## 2016-06-01 MED ORDER — MIDAZOLAM HCL 2 MG/2ML IJ SOLN
INTRAMUSCULAR | Status: AC
  Filled 2016-06-01: qty 2

## 2016-06-01 MED ORDER — LIDOCAINE 2% (20 MG/ML) 5 ML SYRINGE
INTRAMUSCULAR | Status: AC
  Filled 2016-06-01: qty 5

## 2016-06-01 MED ORDER — SODIUM CHLORIDE 0.9 % IV SOLN
INTRAVENOUS | Status: DC | PRN
  Administered 2016-06-01: 5 mL

## 2016-06-01 MED ORDER — LIDOCAINE 2% (20 MG/ML) 5 ML SYRINGE
INTRAMUSCULAR | Status: DC | PRN
  Administered 2016-06-01: 60 mg via INTRAVENOUS

## 2016-06-01 MED ORDER — SUFENTANIL CITRATE 50 MCG/ML IV SOLN
INTRAVENOUS | Status: AC
  Filled 2016-06-01: qty 1

## 2016-06-01 MED ORDER — MIDAZOLAM HCL 5 MG/5ML IJ SOLN
INTRAMUSCULAR | Status: DC | PRN
  Administered 2016-06-01: 2 mg via INTRAVENOUS

## 2016-06-01 MED ORDER — SUFENTANIL CITRATE 50 MCG/ML IV SOLN
INTRAVENOUS | Status: DC | PRN
  Administered 2016-06-01: 5 ug via INTRAVENOUS
  Administered 2016-06-01: 15 ug via INTRAVENOUS

## 2016-06-01 MED ORDER — DIPHENHYDRAMINE HCL 50 MG/ML IJ SOLN
INTRAMUSCULAR | Status: AC
  Filled 2016-06-01: qty 1

## 2016-06-01 MED ORDER — HYDROMORPHONE HCL 1 MG/ML IJ SOLN
0.2500 mg | INTRAMUSCULAR | Status: DC | PRN
  Administered 2016-06-01 (×2): 0.25 mg via INTRAVENOUS

## 2016-06-01 MED ORDER — TRAMADOL HCL 50 MG PO TABS
50.0000 mg | ORAL_TABLET | Freq: Four times a day (QID) | ORAL | Status: DC | PRN
  Administered 2016-06-01 – 2016-06-02 (×2): 50 mg via ORAL
  Filled 2016-06-01 (×2): qty 1

## 2016-06-01 MED ORDER — PROMETHAZINE HCL 25 MG/ML IJ SOLN: 6.2500 mg | INTRAMUSCULAR | Status: DC | PRN

## 2016-06-01 MED ORDER — SODIUM CHLORIDE 0.9 % IJ SOLN
INTRAMUSCULAR | Status: AC
  Filled 2016-06-01: qty 10

## 2016-06-01 MED ORDER — ROCURONIUM BROMIDE 10 MG/ML (PF) SYRINGE
PREFILLED_SYRINGE | INTRAVENOUS | Status: AC
  Filled 2016-06-01: qty 10

## 2016-06-01 MED ORDER — TRAMADOL HCL 50 MG PO TABS: 50.0000 mg | ORAL_TABLET | Freq: Four times a day (QID) | ORAL | Status: DC | PRN

## 2016-06-01 MED ORDER — CEFAZOLIN SODIUM 1 G IJ SOLR
INTRAMUSCULAR | Status: AC
  Filled 2016-06-01: qty 10

## 2016-06-01 MED ORDER — LACTATED RINGERS IV SOLN
INTRAVENOUS | Status: DC
  Administered 2016-06-01: 08:00:00 via INTRAVENOUS

## 2016-06-01 MED ORDER — PROPOFOL 10 MG/ML IV BOLUS
INTRAVENOUS | Status: AC
  Filled 2016-06-01: qty 20

## 2016-06-01 MED ORDER — DEXAMETHASONE SODIUM PHOSPHATE 10 MG/ML IJ SOLN
INTRAMUSCULAR | Status: DC | PRN
  Administered 2016-06-01: 10 mg via INTRAVENOUS

## 2016-06-01 MED ORDER — ONDANSETRON HCL 4 MG/2ML IJ SOLN
INTRAMUSCULAR | Status: DC | PRN
  Administered 2016-06-01: 4 mg via INTRAVENOUS

## 2016-06-01 MED ORDER — HYDROMORPHONE HCL 1 MG/ML IJ SOLN
INTRAMUSCULAR | Status: AC
  Administered 2016-06-01: 0.25 mg via INTRAVENOUS
  Filled 2016-06-01: qty 1

## 2016-06-01 MED ORDER — SODIUM CHLORIDE 0.9 % IR SOLN
Status: DC | PRN
  Administered 2016-06-01: 1000 mL

## 2016-06-01 MED ORDER — DIPHENHYDRAMINE HCL 50 MG/ML IJ SOLN
INTRAMUSCULAR | Status: DC | PRN
  Administered 2016-06-01: 10 mg via INTRAVENOUS

## 2016-06-01 MED ORDER — BUPIVACAINE-EPINEPHRINE (PF) 0.25% -1:200000 IJ SOLN
INTRAMUSCULAR | Status: AC
  Filled 2016-06-01: qty 30

## 2016-06-01 MED ORDER — IOPAMIDOL (ISOVUE-300) INJECTION 61%
INTRAVENOUS | Status: AC
Start: 2016-06-01 — End: 2016-06-01
  Filled 2016-06-01: qty 50

## 2016-06-01 MED ORDER — BUPIVACAINE-EPINEPHRINE 0.25% -1:200000 IJ SOLN
INTRAMUSCULAR | Status: DC | PRN
  Administered 2016-06-01: 20 mL

## 2016-06-01 MED ORDER — 0.9 % SODIUM CHLORIDE (POUR BTL) OPTIME
TOPICAL | Status: DC | PRN
  Administered 2016-06-01: 1000 mL

## 2016-06-01 MED ORDER — SODIUM CHLORIDE 0.9 % IV SOLN: INTRAVENOUS | Status: DC

## 2016-06-01 MED ORDER — ONDANSETRON HCL 4 MG/2ML IJ SOLN
INTRAMUSCULAR | Status: AC
  Filled 2016-06-01: qty 2

## 2016-06-01 MED ORDER — CEFAZOLIN SODIUM 1 G IJ SOLR
INTRAMUSCULAR | Status: DC | PRN
  Administered 2016-06-01: 2 g via INTRAMUSCULAR

## 2016-06-01 MED ORDER — PROPOFOL 10 MG/ML IV BOLUS
INTRAVENOUS | Status: DC | PRN
  Administered 2016-06-01: 130 mg via INTRAVENOUS

## 2016-06-01 MED ORDER — SUGAMMADEX SODIUM 200 MG/2ML IV SOLN
INTRAVENOUS | Status: AC
  Filled 2016-06-01: qty 2

## 2016-06-01 MED ORDER — ROCURONIUM BROMIDE 10 MG/ML (PF) SYRINGE
PREFILLED_SYRINGE | INTRAVENOUS | Status: DC | PRN
  Administered 2016-06-01: 40 mg via INTRAVENOUS

## 2016-06-01 SURGICAL SUPPLY — 43 items
ADH SKN CLS APL DERMABOND .7 (GAUZE/BANDAGES/DRESSINGS) ×1
APPLIER CLIP 5 13 M/L LIGAMAX5 (MISCELLANEOUS) ×3
APR CLP MED LRG 5 ANG JAW (MISCELLANEOUS) ×1
BAG SPEC RTRVL LRG 6X4 10 (ENDOMECHANICALS) ×1
BLADE SURG CLIPPER 3M 9600 (MISCELLANEOUS) IMPLANT
CANISTER SUCTION 2500CC (MISCELLANEOUS) ×3 IMPLANT
CHLORAPREP W/TINT 26ML (MISCELLANEOUS) ×3 IMPLANT
CLIP APPLIE 5 13 M/L LIGAMAX5 (MISCELLANEOUS) ×1 IMPLANT
COVER MAYO STAND STRL (DRAPES) ×3 IMPLANT
COVER SURGICAL LIGHT HANDLE (MISCELLANEOUS) ×3 IMPLANT
DERMABOND ADVANCED (GAUZE/BANDAGES/DRESSINGS) ×2
DERMABOND ADVANCED .7 DNX12 (GAUZE/BANDAGES/DRESSINGS) IMPLANT
DRAPE C-ARM 42X72 X-RAY (DRAPES) ×3 IMPLANT
ELECT REM PT RETURN 9FT ADLT (ELECTROSURGICAL) ×3
ELECTRODE REM PT RTRN 9FT ADLT (ELECTROSURGICAL) ×1 IMPLANT
GLOVE BIO SURGEON STRL SZ7.5 (GLOVE) ×2 IMPLANT
GLOVE BIOGEL PI IND STRL 7.0 (GLOVE) IMPLANT
GLOVE BIOGEL PI IND STRL 7.5 (GLOVE) IMPLANT
GLOVE BIOGEL PI INDICATOR 7.0 (GLOVE) ×2
GLOVE BIOGEL PI INDICATOR 7.5 (GLOVE) ×4
GLOVE SURG SIGNA 7.5 PF LTX (GLOVE) ×3 IMPLANT
GOWN STRL REUS W/ TWL LRG LVL3 (GOWN DISPOSABLE) ×2 IMPLANT
GOWN STRL REUS W/ TWL XL LVL3 (GOWN DISPOSABLE) ×1 IMPLANT
GOWN STRL REUS W/TWL LRG LVL3 (GOWN DISPOSABLE) ×9
GOWN STRL REUS W/TWL XL LVL3 (GOWN DISPOSABLE) ×3
KIT BASIN OR (CUSTOM PROCEDURE TRAY) ×3 IMPLANT
KIT ROOM TURNOVER OR (KITS) ×3 IMPLANT
LIQUID BAND (GAUZE/BANDAGES/DRESSINGS) ×3 IMPLANT
NS IRRIG 1000ML POUR BTL (IV SOLUTION) ×3 IMPLANT
PAD ARMBOARD 7.5X6 YLW CONV (MISCELLANEOUS) ×3 IMPLANT
POUCH SPECIMEN RETRIEVAL 10MM (ENDOMECHANICALS) ×3 IMPLANT
SCISSORS LAP 5X35 DISP (ENDOMECHANICALS) ×3 IMPLANT
SET CHOLANGIOGRAPH 5 50 .035 (SET/KITS/TRAYS/PACK) ×3 IMPLANT
SET IRRIG TUBING LAPAROSCOPIC (IRRIGATION / IRRIGATOR) ×3 IMPLANT
SLEEVE ENDOPATH XCEL 5M (ENDOMECHANICALS) ×6 IMPLANT
SPECIMEN JAR SMALL (MISCELLANEOUS) ×3 IMPLANT
SUT MON AB 4-0 PC3 18 (SUTURE) ×3 IMPLANT
TOWEL OR 17X24 6PK STRL BLUE (TOWEL DISPOSABLE) ×3 IMPLANT
TOWEL OR 17X26 10 PK STRL BLUE (TOWEL DISPOSABLE) ×3 IMPLANT
TRAY LAPAROSCOPIC MC (CUSTOM PROCEDURE TRAY) ×3 IMPLANT
TROCAR XCEL BLUNT TIP 100MML (ENDOMECHANICALS) ×3 IMPLANT
TROCAR XCEL NON-BLD 5MMX100MML (ENDOMECHANICALS) ×3 IMPLANT
TUBING INSUFFLATION (TUBING) ×3 IMPLANT

## 2016-06-01 NOTE — Care Management Note (Signed)
Case Management Note  Patient Details  Name: [name] MRN: [id number] Date of Birth: Feb 17, 1951  Subjective/Objective:         Admitted with gallstone pancreatitis.         S/p lap. Cholecystectomy 06/01/2016  Action/Plan: Return to home when medically stable. CM to f/u with disposition needs.  Expected Discharge Date:                  Expected Discharge Plan:  Home/Self Care  In-House Referral:     Discharge planning Services  CM Consult  Post Acute Care Choice:    Choice offered to:     DME Arranged:    DME Agency:     HH Arranged:    HH Agency:     Status of Service:  In process, will continue to follow  If discussed at Long Length of Stay Meetings, dates discussed:    Additional Comments:  Sharin Mons, RN 06/01/2016, 3:12 PM

## 2016-06-01 NOTE — Plan of Care (Signed)
Problem: Activity: Goal: Ability to return to normal activity level will improve Outcome: Progressing Getting up to BR with assistance  Problem: Bowel/Gastric: Goal: Gastrointestinal status for postoperative course will improve Outcome: Progressing Passing flatus and have bowel sounds

## 2016-06-01 NOTE — Op Note (Signed)
Laparoscopic Cholecystectomy with IOC Procedure Note  Indications: This patient presents with symptomatic gallbladder disease and will undergo laparoscopic cholecystectomy.  Pre-operative Diagnosis: gallstone pancreatitis  Post-operative Diagnosis: Same  Surgeon: Coralie Keens A   Assistants: 0  Anesthesia: General endotracheal anesthesia  ASA Class: 2  Procedure Details  The patient was seen again in the Holding Room. The risks, benefits, complications, treatment options, and expected outcomes were discussed with the patient. The possibilities of reaction to medication, pulmonary aspiration, perforation of viscus, bleeding, recurrent infection, finding a normal gallbladder, the need for additional procedures, failure to diagnose a condition, the possible need to convert to an open procedure, and creating a complication requiring transfusion or operation were discussed with the patient. The likelihood of improving the patient's symptoms with return to their baseline status is good.  The patient and/or family concurred with the proposed plan, giving informed consent. The site of surgery properly noted. The patient was taken to Operating Room, identified as MAYRIM CRAYNE and the procedure verified as Laparoscopic Cholecystectomy with Intraoperative Cholangiogram. A Time Out was held and the above information confirmed.  Prior to the induction of general anesthesia, antibiotic prophylaxis was administered. General endotracheal anesthesia was then administered and tolerated well. After the induction, the abdomen was prepped with Chloraprep and draped in the sterile fashion. The patient was positioned in the supine position.  Local anesthetic agent was injected into the skin near the umbilicus and an incision made. We dissected down to the abdominal fascia with blunt dissection.  The fascia was incised vertically and we entered the peritoneal cavity bluntly.  A pursestring suture of 0-Vicryl  was placed around the fascial opening.  The Hasson cannula was inserted and secured with the stay suture.  Pneumoperitoneum was then created with CO2 and tolerated well without any adverse changes in the patient's vital signs. A 5-mm port was placed in the subxiphoid position.  Two 5-mm ports were placed in the right upper quadrant. All skin incisions were infiltrated with a local anesthetic agent before making the incision and placing the trocars.   We positioned the patient in reverse Trendelenburg, tilted slightly to the patient's left.  The gallbladder was identified, the fundus grasped and retracted cephalad. Adhesions were lysed bluntly and with the electrocautery where indicated, taking care not to injure any adjacent organs or viscus. The infundibulum was grasped and retracted laterally, exposing the peritoneum overlying the triangle of Calot. This was then divided and exposed in a blunt fashion. A critical view of the cystic duct and cystic artery was obtained.  The cystic duct was clearly identified and bluntly dissected circumferentially. The cystic duct was ligated with a clip distally.   An incision was made in the cystic duct and the [facility] cholangiogram catheter introduced. The catheter was secured using a clip. A cholangiogram was then obtained which showed good visualization of the distal and proximal biliary tree with no sign of filling defects or obstruction.  Contrast flowed  into the duodenum. The catheter was then removed.   The cystic duct was then ligated with clips and divided. The cystic artery was identified, dissected free, ligated with clips and divided as well.   The gallbladder was dissected from the liver bed in retrograde fashion with the electrocautery. The gallbladder was removed and placed in an Endocatch sac. The liver bed was irrigated and inspected. Hemostasis was achieved with the electrocautery. Copious irrigation was utilized and was repeatedly aspirated until clear.   The gallbladder and Endocatch sac were then  removed through the umbilical port site.  The pursestring suture was used to close the umbilical fascia.    We again inspected the right upper quadrant for hemostasis.  Pneumoperitoneum was released as we removed the trocars.  4-0 Monocryl was used to close the skin.   Skin glue was then applied. The patient was then extubated and brought to the recovery room in stable condition. Instrument, sponge, and needle counts were correct at closure and at the conclusion of the case.   Findings: Cholecystitis with Cholelithiasis  Estimated Blood Loss: Minimal         Drains: 0         Specimens: Gallbladder           Complications: None; patient tolerated the procedure well.         Disposition: PACU - hemodynamically stable.         Condition: stable

## 2016-06-01 NOTE — Progress Notes (Signed)
[name] 10:51 AM  Subjective: Patient seen in recovery room and her op note reviewed and her [place] reviewed and she is doing fine from her surgery  Objective: Vital signs stable afebrile no acute distress patient not examined today in recovery room looks good postop labs improved liver tests normal white count normal lipase near normal IOC okay  Assessment: Gallstone pancreatitis seemingly resolved status post laparoscopic cholecystectomy and negative IOC  Plan: Thanks to surgical team for their quick work and please call me if I could be of any further assistance with this hospital stay  [facility]  Pager [phone] After 5PM or if no answer call [phone]

## 2016-06-01 NOTE — Transfer of Care (Signed)
Immediate Anesthesia Transfer of Care Note  Patient: [name]  Procedure(s) Performed: Procedure(s): LAPAROSCOPIC CHOLECYSTECTOMY WITH INTRAOPERATIVE CHOLANGIOGRAM (N/A)  Patient Location: PACU  Anesthesia Type:General  Level of Consciousness: awake, oriented and patient cooperative  Airway & Oxygen Therapy: Patient Spontanous Breathing and Patient connected to nasal cannula oxygen  Post-op Assessment: Report given to RN, Post -op Vital signs reviewed and stable and Patient moving all extremities  Post vital signs: Reviewed and stable  Last Vitals:  Vitals:   05/31/16 2122 06/01/16 0456  BP: (!) 127/55 (!) 108/51  Pulse: 92 79  Resp:  18  Temp:  37.5 C    Last Pain:  Vitals:   06/01/16 0456  TempSrc: Oral  PainSc:       Patients Stated Pain Goal: 0 ([id number])  Complications: No apparent anesthesia complications

## 2016-06-01 NOTE — Progress Notes (Signed)
S: Abdominal pain much improved.   Vitals, labs, intake/output, and orders reviewed at this time. WBC has normalized. Lipase and LFT pending  Gen: A&Ox3, no distress, walking around the room H&N: EOMI, atraumatic, neck supple Chest: unlabored respirations, RRR Abd: soft, mildly tender epigastrium, nondistended Ext: warm, no edema Neuro: grossly normal  Lines/tubes/drains: PIV  A/P:  Gallstone pancreatitis. Likely OR today   Romana Juniper, MD [facility], [place] Pager [phone]

## 2016-06-01 NOTE — Anesthesia Procedure Notes (Signed)
Procedure Name: Intubation Date/Time: 06/01/2016 9:11 AM Performed by: Melina Copa, Camela Wich R Pre-anesthesia Checklist: Patient identified, Emergency Drugs available, Suction available and Patient being monitored Patient Re-evaluated:Patient Re-evaluated prior to inductionOxygen Delivery Method: Circle System Utilized Preoxygenation: Pre-oxygenation with 100% oxygen Intubation Type: IV induction Ventilation: Mask ventilation without difficulty Laryngoscope Size: Mac and 3 Grade View: Grade III Tube type: Oral Tube size: 7.0 mm Number of attempts: 1 Airway Equipment and Method: Stylet Placement Confirmation: ETT inserted through vocal cords under direct vision,  positive ETCO2 and breath sounds checked- equal and bilateral Secured at: 21 cm Tube secured with: Tape Dental Injury: Teeth and Oropharynx as per pre-operative assessment

## 2016-06-01 NOTE — Anesthesia Postprocedure Evaluation (Signed)
Anesthesia Post Note  Patient: TANARA PENAFIEL  Procedure(s) Performed: Procedure(s) (LRB): LAPAROSCOPIC CHOLECYSTECTOMY WITH INTRAOPERATIVE CHOLANGIOGRAM (N/A)  Patient location during evaluation: PACU Anesthesia Type: General Level of consciousness: awake and alert Pain management: pain level controlled Vital Signs Assessment: post-procedure vital signs reviewed and stable Respiratory status: spontaneous breathing, nonlabored ventilation, respiratory function stable and patient connected to nasal cannula oxygen Cardiovascular status: blood pressure returned to baseline and stable Postop Assessment: no signs of nausea or vomiting Anesthetic complications: no    Last Vitals:  Vitals:   06/01/16 1002 06/01/16 1004  BP: 97/77   Pulse: (!) 115   Resp: (!) 25   Temp:  36.8 C    Last Pain:  Vitals:   06/01/16 1004  TempSrc:   PainSc: 4                  Patterson Hollenbaugh S

## 2016-06-01 NOTE — Anesthesia Preprocedure Evaluation (Signed)
Anesthesia Evaluation  Patient identified by MRN, date of birth, ID band Patient awake    Reviewed: Allergy & Precautions, NPO status , Patient's Chart, lab work & pertinent test results  Airway Mallampati: II  TM Distance: >3 FB Neck ROM: Full    Dental no notable dental hx.    Pulmonary neg pulmonary ROS,    Pulmonary exam normal breath sounds clear to auscultation       Cardiovascular negative cardio ROS Normal cardiovascular exam Rhythm:Regular Rate:Normal     Neuro/Psych negative neurological ROS  negative psych ROS   GI/Hepatic negative GI ROS, Neg liver ROS,   Endo/Other  negative endocrine ROS  Renal/GU negative Renal ROS  negative genitourinary   Musculoskeletal negative musculoskeletal ROS (+)   Abdominal   Peds negative pediatric ROS (+)  Hematology negative hematology ROS (+)   Anesthesia Other Findings   Reproductive/Obstetrics negative OB ROS                            Anesthesia Physical Anesthesia Plan  ASA: II  Anesthesia Plan: General   Post-op Pain Management:    Induction: Intravenous  Airway Management Planned: Oral ETT  Additional Equipment:   Intra-op Plan:   Post-operative Plan: Extubation in OR  Informed Consent: I have reviewed the patients History and Physical, chart, labs and discussed the procedure including the risks, benefits and alternatives for the proposed anesthesia with the patient or authorized representative who has indicated his/her understanding and acceptance.   Dental advisory given  Plan Discussed with: CRNA and Surgeon  Anesthesia Plan Comments:         Anesthesia Quick Evaluation  

## 2016-06-01 NOTE — Progress Notes (Signed)
Patient ID: [name], female   DOB: Jan 10, 1951, 65 y.o.   MRN: [id number]  Improved this morning so will proceed with lap chole. I discussed the procedure in detail.   We discussed the risks and benefits of a laparoscopic cholecystectomy and cholangiogram including, but not limited to bleeding, infection, injury to surrounding structures such as the intestine or liver, bile leak, retained gallstones, need to convert to an open procedure, prolonged diarrhea, blood clots such as  DVT, common bile duct injury, anesthesia risks, and possible need for additional procedures.  The likelihood of improvement in symptoms and return to the patient's normal status is good. We discussed the typical post-operative recovery course.

## 2016-06-01 NOTE — Progress Notes (Signed)
PROGRESS NOTE    [name]  [id number] DOB: 1951/05/20 DOA: 05/31/2016 PCP: Vidal Schwalbe, MD   Outpatient Specialists:     Brief Narrative:   [name] is a 65 y.o. female with medical history significant of SBO, IBS, diverticulosis, and arthritis; who presents with a one-day history of epigastric pain. Patient states that she did not feel well yesterday. Around 7:30 PM last night she reported having acute onset of a crampy and sharp epigastric abdominal pain. Symptoms waxed and waned in intensity. Tried Advil without relief of symptoms. Associated symptoms included nausea, vomiting. Symptoms worsened with any movements. Symptoms feel similar to her previous partial bowel obstruction back in 2004. Denies fever, dysuria, shortness of breath, blood in stool/urine, or focal weakness. She notes that she does not drink alcohol and rarely takes medications. Occasionally taking NSAID here and there for arthritis pain.   Assessment & Plan:   Principal Problem:   Acute pancreatitis Active Problems:   Pancreatitis   Nausea and vomiting   Leukocytosis   Hyperglycemia   Acute pancreatitis: due to gallstone -s/p GB removal on 10/3  Leukocytosis:  resolved  Nausea and vomiting - Zofran/Phenergan prn N/V  Hyperglycemia:  -monitor   DVT prophylaxis:  SCD's  Code Status: Full Code   Family Communication: No family at bedside  Disposition Plan:     Consultants:   GI  General surgery  Procedures:        Subjective: Back from surgery Having to urinate a lot-- no burning  Objective: Vitals:   06/01/16 1031 06/01/16 1047 06/01/16 1054 06/01/16 1131  BP: 133/68 131/61  (!) 130/56  Pulse: (!) 104 95  91  Resp: 20 (!) 24  16  Temp:   98.2 F (36.8 C) 99.1 F (37.3 C)  TempSrc:    Oral  SpO2: 93% 93%  95%  Weight:      Height:        Intake/Output Summary (Last 24 hours) at 06/01/16 1651 Last data filed at 06/01/16 1500  Gross per  24 hour  Intake          2353.33 ml  Output             2552 ml  Net          -198.67 ml   Filed Weights   05/30/16 2332 05/31/16 0839  Weight: 71.2 kg (157 lb) 74.1 kg (163 lb 5.8 oz)    Examination:  General exam: Appears calm and comfortable  Respiratory system: Clear to auscultation. Respiratory effort normal. Cardiovascular system: S1 & S2 heard, RRR. No JVD, murmurs, rubs, gallops or clicks. No pedal edema. Gastrointestinal system: minimal tenderness Central nervous system: Alert and oriented. No focal neurological deficits. Extremities: Symmetric 5 x 5 power.     Data Reviewed: I have personally reviewed following labs and imaging studies  CBC:  Recent Labs Lab 05/30/16 2336 05/31/16 0506 06/01/16 0458  WBC 14.9* 12.2* 9.8  NEUTROABS  --   --  6.6  HGB 14.5 13.2 11.3*  HCT 44.3 40.9 36.1  MCV 87.0 87.4 88.9  PLT 300 258 [id number]   Basic Metabolic Panel:  Recent Labs Lab 05/30/16 2336 05/31/16 0506 06/01/16 0458  NA 139 137 139  K 3.5 3.3* 3.8  CL 106 107 113*  CO2 22 22 23   GLUCOSE 182* 160* 101*  BUN 8 5* <5*  CREATININE 0.77 0.59 0.57  CALCIUM 10.1 8.6* 8.5*   GFR: Estimated Creatinine Clearance: 68.3 mL/min (by  C-G formula based on SCr of 0.57 mg/dL). Liver Function Tests:  Recent Labs Lab 05/30/16 2336 05/31/16 0506 06/01/16 0458  AST 55* 47* 22  ALT 37 35 24  ALKPHOS 101 83 76  BILITOT 0.7 0.8 0.7  PROT 6.6 5.8* 5.0*  ALBUMIN 4.2 3.7 3.0*    Recent Labs Lab 05/30/16 2336 06/01/16 0458  LIPASE 1,994* 158*   No results for input(s): AMMONIA in the last 168 hours. Coagulation Profile: No results for input(s): INR, PROTIME in the last 168 hours. Cardiac Enzymes: No results for input(s): CKTOTAL, CKMB, CKMBINDEX, TROPONINI in the last 168 hours. BNP (last 3 results) No results for input(s): PROBNP in the last 8760 hours. HbA1C: No results for input(s): HGBA1C in the last 72 hours. CBG: No results for input(s): GLUCAP in the last  168 hours. Lipid Profile:  Recent Labs  05/31/16 0506  CHOL 143  HDL 44  LDLCALC 81  TRIG 91  CHOLHDL 3.3   Thyroid Function Tests: No results for input(s): TSH, T4TOTAL, FREET4, T3FREE, THYROIDAB in the last 72 hours. Anemia Panel: No results for input(s): VITAMINB12, FOLATE, FERRITIN, TIBC, IRON, RETICCTPCT in the last 72 hours. Urine analysis:    Component Value Date/Time   COLORURINE YELLOW 05/30/2016 2342   APPEARANCEUR CLOUDY (A) 05/30/2016 2342   LABSPEC 1.025 05/30/2016 2342   PHURINE 5.5 05/30/2016 2342   GLUCOSEU NEGATIVE 05/30/2016 2342   HGBUR SMALL (A) 05/30/2016 2342   BILIRUBINUR NEGATIVE 05/30/2016 2342   KETONESUR 15 (A) 05/30/2016 2342   PROTEINUR NEGATIVE 05/30/2016 2342   UROBILINOGEN 0.2 11/02/2014 0553   NITRITE NEGATIVE 05/30/2016 2342   LEUKOCYTESUR NEGATIVE 05/30/2016 2342     ) Recent Results (from the past 240 hour(s))  Culture, Urine     Status: None   Collection Time: 05/31/16 11:52 AM  Result Value Ref Range Status   Specimen Description URINE, CLEAN CATCH  Final   Special Requests NONE  Final   Culture NO GROWTH  Final   Report Status 06/01/2016 FINAL  Final  Surgical pcr screen     Status: None   Collection Time: 05/31/16  7:50 PM  Result Value Ref Range Status   MRSA, PCR NEGATIVE NEGATIVE Final   Staphylococcus aureus NEGATIVE NEGATIVE Final    Comment:        The Xpert SA Assay (FDA approved for NASAL specimens in patients over 18 years of age), is one component of a comprehensive surveillance program.  Test performance has been validated by [facility] for patients greater than or equal to 101 year old. It is not intended to diagnose infection nor to guide or monitor treatment.       Anti-infectives    Start     Dose/Rate Route Frequency Ordered Stop   05/31/16 1000  cefTRIAXone (ROCEPHIN) 1 g in dextrose 5 % 50 mL IVPB     1 g 100 mL/hr over 30 Minutes Intravenous Every 24 hours 05/31/16 [id number]          Radiology Studies: Dg Cholangiogram Operative  Result Date: 06/01/2016 CLINICAL DATA:  65 year old female undergoing laparoscopic cholecystectomy for acute cholecystitis. EXAM: INTRAOPERATIVE CHOLANGIOGRAM TECHNIQUE: Cholangiographic images from the C-arm fluoroscopic device were submitted for interpretation post-operatively. Please see the procedural report for the amount of contrast and the fluoroscopy time utilized. COMPARISON:  Right upper quadrant ultrasound 05/31/2016 FINDINGS: A cine clip obtained during intraoperative cholangiogram at the time of laparoscopic cholecystectomy demonstrates cannulation of the cystic duct remanent and opacification of the biliary  tree. There is no biliary ductal dilatation, stenosis, stricture or choledocholithiasis. Contrast material passes through the ampulla and into the duodenum. There is mild retrograde filling of the main pancreatic duct. Injected contrast material extravasates into the peritoneal space from the site of cannulation. IMPRESSION: Negative intraoperative cholangiogram. Electronically Signed   By: Jacqulynn Cadet M.D.   On: 06/01/2016 09:51   Ct Abdomen Pelvis W Contrast  Result Date: 05/31/2016 CLINICAL DATA:  66 year old female with abdominal pain and vomiting. EXAM: CT ABDOMEN AND PELVIS WITH CONTRAST TECHNIQUE: Multidetector CT imaging of the abdomen and pelvis was performed using the standard protocol following bolus administration of intravenous contrast. CONTRAST:  80 cc Isovue-300 COMPARISON:  Abdominal CT dated 11/02/2014 FINDINGS: Lower chest: The visualized lung bases are clear. No intra-abdominal free air. Small free fluid in the upper abdomen tracking along the duodenum and inferior to the porta hepaticus. Small perihepatic free fluid. Hepatobiliary: The liver appears unremarkable. No intrahepatic biliary ductal dilatation. The gallbladder is mildly distended. There is inflammatory changes of the gallbladder with a small  pericholecystic fluid. No definite calcifying gallstone identified. Pancreas: There is inflammatory changes of the pancreas compatible with acute pancreatitis. There is no fluid collection or abscess or pseudocyst. Findings may be related to underlying gallbladder disease and gallstone pancreatitis. No definite stone identified in the CBD on the CT. MRI/MRCP may provide better evaluation. Spleen: Normal in size without focal abnormality. Adrenals/Urinary Tract: Adrenal glands are unremarkable. Kidneys are normal, without renal calculi, focal lesion, or hydronephrosis. Bladder is unremarkable. Stomach/Bowel: There is extensive sigmoid diverticulosis without active inflammatory changes. No evidence of bowel obstruction. Small hiatal hernia. There is inflammatory changes of the duodenal C-loop secondary to inflammatory changes of the pancreas. Normal appendix Vascular/Lymphatic: The abdominal aorta and IVC appear unremarkable. No portal venous gas identified. The splenic vein, SMV, and main portal vein are patent. There is no adenopathy. Reproductive: Hysterectomy. Other: Small fat containing umbilical hernia. Musculoskeletal: L2 inferior endplate Schmorl nodes. The osseous structures are otherwise intact. IMPRESSION: Acute pancreatitis with inflammatory changes of the gallbladder. Findings may represent gallstone pancreatitis. No definite stone identified within the CBD. MRI/ MRCP may provide better evaluation of the pancreas and the gallbladder. Diverticulosis.  No evidence of bowel obstruction.  Normal appendix. Electronically Signed   By: Anner Crete M.D.   On: 05/31/2016 03:11   US Abdomen Limited Ruq  Result Date: 05/31/2016 CLINICAL DATA:  History of pancreatitis. Onset of mid and right upper quadrant abdominal pain last evening. EXAM: US ABDOMEN LIMITED - RIGHT UPPER QUADRANT COMPARISON:  Abdominal pelvic CT scan of today's date FINDINGS: Gallbladder: The gallbladder is adequately distended. Stones and  polyps are visible. The gallstone measures 5.6 mm in diameter. The polyp measures 2.1 mm in diameter. There is pericholecystic fluid. The gallbladder wall is top normal at 2.8 mm. No sonographic Murphy's sign could be assessed due to the patient's medicated status. Common bile duct: Diameter: 7.4 mm.  No definite intraluminal stones are observed. Liver: The hepatic echotexture is increased. There is no focal mass nor ductal dilation. IMPRESSION: Gallstones. Mild pericholecystic fluid with top-normal gallbladder wall thickness it. No definite sonographic Murphy's sign but the patient has had pain medication. Mild dilation of the common bile duct without definite common bile duct stones. Fatty infiltrative change of the liver. Electronically Signed   By: David  Martinique M.D.   On: 05/31/2016 10:15        Scheduled Meds: . cefTRIAXone (ROCEPHIN)  IV  1 g Intravenous Q24H  .  docusate sodium  100 mg Oral BID   Continuous Infusions:    LOS: 1 day    Time spent: 25 min    [place], DO [facility] Pager [phone]  If 7PM-7AM, please contact night-coverage [email or web address] Password [email or web address] 06/01/2016, 4:51 PM

## 2016-06-02 ENCOUNTER — Encounter (HOSPITAL_COMMUNITY): Payer: Self-pay | Admitting: Surgery

## 2016-06-02 LAB — CBC
HEMATOCRIT: 36.9 % (ref 36.0–46.0)
HEMOGLOBIN: 11.7 g/dL — AB (ref 12.0–15.0)
MCH: 27.9 pg (ref 26.0–34.0)
MCHC: 31.7 g/dL (ref 30.0–36.0)
MCV: 88.1 fL (ref 78.0–100.0)
Platelets: 227 10*3/uL (ref 150–400)
RBC: 4.19 MIL/uL (ref 3.87–5.11)
RDW: 13.9 % (ref 11.5–15.5)
WBC: 16.9 10*3/uL — ABNORMAL HIGH (ref 4.0–10.5)

## 2016-06-02 LAB — COMPREHENSIVE METABOLIC PANEL
ALK PHOS: 164 U/L — AB (ref 38–126)
ALT: 155 U/L — AB (ref 14–54)
AST: 124 U/L — ABNORMAL HIGH (ref 15–41)
Albumin: 3.1 g/dL — ABNORMAL LOW (ref 3.5–5.0)
Anion gap: 5 (ref 5–15)
BILIRUBIN TOTAL: 0.8 mg/dL (ref 0.3–1.2)
BUN: 5 mg/dL — ABNORMAL LOW (ref 6–20)
CALCIUM: 9.6 mg/dL (ref 8.9–10.3)
CO2: 27 mmol/L (ref 22–32)
CREATININE: 0.54 mg/dL (ref 0.44–1.00)
Chloride: 108 mmol/L (ref 101–111)
Glucose, Bld: 113 mg/dL — ABNORMAL HIGH (ref 65–99)
Potassium: 3.8 mmol/L (ref 3.5–5.1)
SODIUM: 140 mmol/L (ref 135–145)
TOTAL PROTEIN: 5.5 g/dL — AB (ref 6.5–8.1)

## 2016-06-02 MED ORDER — TRAMADOL HCL 50 MG PO TABS: 50.0000 mg | ORAL_TABLET | Freq: Four times a day (QID) | ORAL | 0 refills | Status: DC | PRN

## 2016-06-02 NOTE — Discharge Summary (Signed)
Physician Discharge Summary  BETSIE HEIMBERGER [id number] DOB: [birth year] DOA: 05/31/2016  PCP: Vidal Schwalbe, MD  Admit date: 05/31/2016 Discharge date: 06/02/2016   Recommendations for Outpatient Follow-Up:   1.    Discharge Diagnosis:   Principal Problem:   Acute pancreatitis Active Problems:   Pancreatitis   Nausea and vomiting   Leukocytosis   Hyperglycemia   Discharge disposition:  Home  Discharge Condition: Improved.  Diet recommendation: Low sodium, heart healthy  Wound care: None.   History of Present Illness:   [name] is a 65 y.o. female with medical history significant of SBO, IBS, diverticulosis, and arthritis; who presents with a one-day history of epigastric pain. Patient states that she did not feel well yesterday. Around 7:30 PM last night she reported having acute onset of a crampy and sharp epigastric abdominal pain. Symptoms waxed and waned in intensity. Tried Advil without relief of symptoms. Associated symptoms included nausea, vomiting. Symptoms worsened with any movements. Symptoms feel similar to her previous partial bowel obstruction back in 2004. Denies fever, dysuria, shortness of breath, blood in stool/urine, or focal weakness. She notes that she does not drink alcohol and rarely takes medications. Occasionally taking NSAID here and there for arthritis pain.    Hospital Course by Problem:   Acute pancreatitis: due to gallstone -s/p GB removal on 06/01/16 -doing well for d/c on 10/4  Leukocytosis:  No fever -continue incentive spirometry -UTI treated  Nausea and vomiting - resovled  Hyperglycemia:  -outpatient follow up   Medical Consultants:   GI surgery  Discharge Exam:   Vitals:   06/01/16 2138 06/02/16 0559  BP: (!) 91/48 (!) 110/54  Pulse: 91 74  Resp: 18 18  Temp: 98.3 F (36.8 C) 98.2 F (36.8 C)   Vitals:   06/01/16 1054 06/01/16 1131 06/01/16 2138 06/02/16 0559  BP:  (!) 130/56 (!) 91/48  (!) 110/54  Pulse:  91 91 74  Resp:  16 18 18   Temp: 98.2 F (36.8 C) 99.1 F (37.3 C) 98.3 F (36.8 C) 98.2 F (36.8 C)  TempSrc:  Oral  Oral  SpO2:  95% 92% 95%  Weight:      Height:        Gen:  NAD-- up walking around   The results of significant diagnostics from this hospitalization (including imaging, microbiology, ancillary and laboratory) are listed below for reference.     Procedures and Diagnostic Studies:   Dg Cholangiogram Operative  Result Date: 06/01/2016 CLINICAL DATA:  65 year old female undergoing laparoscopic cholecystectomy for acute cholecystitis. EXAM: INTRAOPERATIVE CHOLANGIOGRAM TECHNIQUE: Cholangiographic images from the C-arm fluoroscopic device were submitted for interpretation post-operatively. Please see the procedural report for the amount of contrast and the fluoroscopy time utilized. COMPARISON:  Right upper quadrant ultrasound 05/31/2016 FINDINGS: A cine clip obtained during intraoperative cholangiogram at the time of laparoscopic cholecystectomy demonstrates cannulation of the cystic duct remanent and opacification of the biliary tree. There is no biliary ductal dilatation, stenosis, stricture or choledocholithiasis. Contrast material passes through the ampulla and into the duodenum. There is mild retrograde filling of the main pancreatic duct. Injected contrast material extravasates into the peritoneal space from the site of cannulation. IMPRESSION: Negative intraoperative cholangiogram. Electronically Signed   By: Jacqulynn Cadet M.D.   On: 06/01/2016 09:51   Ct Abdomen Pelvis W Contrast  Result Date: 05/31/2016 CLINICAL DATA:  65 year old female with abdominal pain and vomiting. EXAM: CT ABDOMEN AND PELVIS WITH CONTRAST TECHNIQUE: Multidetector CT imaging of the abdomen  and pelvis was performed using the standard protocol following bolus administration of intravenous contrast. CONTRAST:  80 cc Isovue-300 COMPARISON:  Abdominal CT dated 11/02/2014  FINDINGS: Lower chest: The visualized lung bases are clear. No intra-abdominal free air. Small free fluid in the upper abdomen tracking along the duodenum and inferior to the porta hepaticus. Small perihepatic free fluid. Hepatobiliary: The liver appears unremarkable. No intrahepatic biliary ductal dilatation. The gallbladder is mildly distended. There is inflammatory changes of the gallbladder with a small pericholecystic fluid. No definite calcifying gallstone identified. Pancreas: There is inflammatory changes of the pancreas compatible with acute pancreatitis. There is no fluid collection or abscess or pseudocyst. Findings may be related to underlying gallbladder disease and gallstone pancreatitis. No definite stone identified in the CBD on the CT. MRI/MRCP may provide better evaluation. Spleen: Normal in size without focal abnormality. Adrenals/Urinary Tract: Adrenal glands are unremarkable. Kidneys are normal, without renal calculi, focal lesion, or hydronephrosis. Bladder is unremarkable. Stomach/Bowel: There is extensive sigmoid diverticulosis without active inflammatory changes. No evidence of bowel obstruction. Small hiatal hernia. There is inflammatory changes of the duodenal C-loop secondary to inflammatory changes of the pancreas. Normal appendix Vascular/Lymphatic: The abdominal aorta and IVC appear unremarkable. No portal venous gas identified. The splenic vein, SMV, and main portal vein are patent. There is no adenopathy. Reproductive: Hysterectomy. Other: Small fat containing umbilical hernia. Musculoskeletal: L2 inferior endplate Schmorl nodes. The osseous structures are otherwise intact. IMPRESSION: Acute pancreatitis with inflammatory changes of the gallbladder. Findings may represent gallstone pancreatitis. No definite stone identified within the CBD. MRI/ MRCP may provide better evaluation of the pancreas and the gallbladder. Diverticulosis.  No evidence of bowel obstruction.  Normal appendix.  Electronically Signed   By: Anner Crete M.D.   On: 05/31/2016 03:11   US Abdomen Limited Ruq  Result Date: 05/31/2016 CLINICAL DATA:  History of pancreatitis. Onset of mid and right upper quadrant abdominal pain last evening. EXAM: US ABDOMEN LIMITED - RIGHT UPPER QUADRANT COMPARISON:  Abdominal pelvic CT scan of today's date FINDINGS: Gallbladder: The gallbladder is adequately distended. Stones and polyps are visible. The gallstone measures 5.6 mm in diameter. The polyp measures 2.1 mm in diameter. There is pericholecystic fluid. The gallbladder wall is top normal at 2.8 mm. No sonographic Murphy's sign could be assessed due to the patient's medicated status. Common bile duct: Diameter: 7.4 mm.  No definite intraluminal stones are observed. Liver: The hepatic echotexture is increased. There is no focal mass nor ductal dilation. IMPRESSION: Gallstones. Mild pericholecystic fluid with top-normal gallbladder wall thickness it. No definite sonographic Murphy's sign but the patient has had pain medication. Mild dilation of the common bile duct without definite common bile duct stones. Fatty infiltrative change of the liver. Electronically Signed   By: David  Martinique M.D.   On: 05/31/2016 10:15     Labs:   Basic Metabolic Panel:  Recent Labs Lab 05/30/16 2336 05/31/16 0506 06/01/16 0458 06/02/16 0524  NA 139 137 139 140  K 3.5 3.3* 3.8 3.8  CL 106 107 113* 108  CO2 22 22 23 27   GLUCOSE 182* 160* 101* 113*  BUN 8 5* <5* 5*  CREATININE 0.77 0.59 0.57 0.54  CALCIUM 10.1 8.6* 8.5* 9.6   GFR Estimated Creatinine Clearance: 68.3 mL/min (by C-G formula based on SCr of 0.54 mg/dL). Liver Function Tests:  Recent Labs Lab 05/30/16 2336 05/31/16 0506 06/01/16 0458 06/02/16 0524  AST 55* 47* 22 124*  ALT 37 35 24 155*  ALKPHOS  101 83 76 164*  BILITOT 0.7 0.8 0.7 0.8  PROT 6.6 5.8* 5.0* 5.5*  ALBUMIN 4.2 3.7 3.0* 3.1*    Recent Labs Lab 05/30/16 2336 06/01/16 0458  LIPASE 1,994*  158*   No results for input(s): AMMONIA in the last 168 hours. Coagulation profile No results for input(s): INR, PROTIME in the last 168 hours.  CBC:  Recent Labs Lab 05/30/16 2336 05/31/16 0506 06/01/16 0458 06/02/16 0524  WBC 14.9* 12.2* 9.8 16.9*  NEUTROABS  --   --  6.6  --   HGB 14.5 13.2 11.3* 11.7*  HCT 44.3 40.9 36.1 36.9  MCV 87.0 87.4 88.9 88.1  PLT 300 258 210 227   Cardiac Enzymes: No results for input(s): CKTOTAL, CKMB, CKMBINDEX, TROPONINI in the last 168 hours. BNP: Invalid input(s): POCBNP CBG: No results for input(s): GLUCAP in the last 168 hours. D-Dimer No results for input(s): DDIMER in the last 72 hours. Hgb A1c No results for input(s): HGBA1C in the last 72 hours. Lipid Profile  Recent Labs  05/31/16 0506  CHOL 143  HDL 44  LDLCALC 81  TRIG 91  CHOLHDL 3.3   Thyroid function studies No results for input(s): TSH, T4TOTAL, T3FREE, THYROIDAB in the last 72 hours.  Invalid input(s): FREET3 Anemia work up No results for input(s): VITAMINB12, FOLATE, FERRITIN, TIBC, IRON, RETICCTPCT in the last 72 hours. Microbiology Recent Results (from the past 240 hour(s))  Culture, Urine     Status: None   Collection Time: 05/31/16 11:52 AM  Result Value Ref Range Status   Specimen Description URINE, CLEAN CATCH  Final   Special Requests NONE  Final   Culture NO GROWTH  Final   Report Status 06/01/2016 FINAL  Final  Surgical pcr screen     Status: None   Collection Time: 05/31/16  7:50 PM  Result Value Ref Range Status   MRSA, PCR NEGATIVE NEGATIVE Final   Staphylococcus aureus NEGATIVE NEGATIVE Final    Comment:        The Xpert SA Assay (FDA approved for NASAL specimens in patients over 15 years of age), is one component of a comprehensive surveillance program.  Test performance has been validated by [facility] for patients greater than or equal to 40 year old. It is not intended to diagnose infection nor to guide or monitor treatment.        Discharge Instructions:   Discharge Instructions    Diet - low sodium heart healthy    Complete by:  As directed    Increase activity slowly    Complete by:  As directed        Medication List    STOP taking these medications   ibuprofen 200 MG tablet Commonly known as:  ADVIL,MOTRIN     TAKE these medications   ondansetron 4 MG disintegrating tablet Commonly known as:  ZOFRAN ODT 4mg  ODT q4 hours prn nausea/vomit   REFRESH OP Apply 1-2 drops to eye daily as needed (dry eyes).   traMADol 50 MG tablet Commonly known as:  ULTRAM Take 1 tablet (50 mg total) by mouth every 6 (six) hours as needed for moderate pain.   Vitamin D 2000 units Caps Take 5,000 Units by mouth daily.      Follow-up Information    Harl Bowie, MD. Call on 06/21/2016.   Specialty:  General Surgery Why:  Appointment with Dr. Ninfa Linden is on 06/21/16 at 2:40pm Contact information: [place] [address] [place] [address] [phone]  Vidal Schwalbe, MD On 06/08/2016.   Specialty:  Family Medicine Why:  Appointment with Dr. Dema Severin is on 06/08/16 at 4:15pm Contact information: [place] [address] [phone]            Time coordinating discharge: 35 min  Signed:  Helyn Schwan Alison Stalling   [facility] 06/02/2016, 2:38 PM

## 2016-06-02 NOTE — Progress Notes (Signed)
Discharge education provided to patient and husband, both verbalized understanding, prescriptions given to patient, and IV removed. Betha Loa Hermen Mario, RN

## 2016-06-02 NOTE — Progress Notes (Signed)
Patient ID: [name], female   DOB: [birth year], 65 y.o.   MRN: [id number]  [facility] Surgery Progress Note  1 Day Post-Op  Subjective: Feeling well this morning, just hungry. Pain well controlled. Ambulating with ease. Passing flatus.  Objective: Vital signs in last 24 hours: Temp:  [98.2 F (36.8 C)-99.1 F (37.3 C)] 98.2 F (36.8 C) (10/04 0559) Pulse Rate:  [74-115] 74 (10/04 0559) Resp:  [16-25] 18 (10/04 0559) BP: (91-133)/(48-77) 110/54 (10/04 0559) SpO2:  [92 %-95 %] 95 % (10/04 0559) Last BM Date: 05/30/16  Intake/Output from previous day: 10/03 0701 - 10/04 0700 In: 1020 [P.O.:120; I.V.:900] Out: 2653 [Urine:2653] Intake/Output this shift: No intake/output data recorded.  PE: Gen:  Alert, NAD, pleasant Abd: Soft, appropriately tender, ND, incisions C/D/I  Lab Results:   Recent Labs  06/01/16 0458 06/02/16 0524  WBC 9.8 16.9*  HGB 11.3* 11.7*  HCT 36.1 36.9  PLT 210 227   BMET  Recent Labs  06/01/16 0458 06/02/16 0524  NA 139 140  K 3.8 3.8  CL 113* 108  CO2 23 27  GLUCOSE 101* 113*  BUN <5* 5*  CREATININE 0.57 0.54  CALCIUM 8.5* 9.6   PT/INR No results for input(s): LABPROT, INR in the last 72 hours. CMP     Component Value Date/Time   NA 140 06/02/2016 0524   K 3.8 06/02/2016 0524   CL 108 06/02/2016 0524   CO2 27 06/02/2016 0524   GLUCOSE 113 (H) 06/02/2016 0524   BUN 5 (L) 06/02/2016 0524   CREATININE 0.54 06/02/2016 0524   CALCIUM 9.6 06/02/2016 0524   PROT 5.5 (L) 06/02/2016 0524   ALBUMIN 3.1 (L) 06/02/2016 0524   AST 124 (H) 06/02/2016 0524   ALT 155 (H) 06/02/2016 0524   ALKPHOS 164 (H) 06/02/2016 0524   BILITOT 0.8 06/02/2016 0524   GFRNONAA >60 06/02/2016 0524   GFRAA >60 06/02/2016 0524   Lipase     Component Value Date/Time   LIPASE 158 (H) 06/01/2016 0458       Studies/Results: Dg Cholangiogram Operative  Result Date: 06/01/2016 CLINICAL DATA:  65 year old female undergoing laparoscopic  cholecystectomy for acute cholecystitis. EXAM: INTRAOPERATIVE CHOLANGIOGRAM TECHNIQUE: Cholangiographic images from the C-arm fluoroscopic device were submitted for interpretation post-operatively. Please see the procedural report for the amount of contrast and the fluoroscopy time utilized. COMPARISON:  Right upper quadrant ultrasound 05/31/2016 FINDINGS: A cine clip obtained during intraoperative cholangiogram at the time of laparoscopic cholecystectomy demonstrates cannulation of the cystic duct remanent and opacification of the biliary tree. There is no biliary ductal dilatation, stenosis, stricture or choledocholithiasis. Contrast material passes through the ampulla and into the duodenum. There is mild retrograde filling of the main pancreatic duct. Injected contrast material extravasates into the peritoneal space from the site of cannulation. IMPRESSION: Negative intraoperative cholangiogram. Electronically Signed   By: Jacqulynn Cadet M.D.   On: 06/01/2016 09:51   US Abdomen Limited Ruq  Result Date: 05/31/2016 CLINICAL DATA:  History of pancreatitis. Onset of mid and right upper quadrant abdominal pain last evening. EXAM: US ABDOMEN LIMITED - RIGHT UPPER QUADRANT COMPARISON:  Abdominal pelvic CT scan of today's date FINDINGS: Gallbladder: The gallbladder is adequately distended. Stones and polyps are visible. The gallstone measures 5.6 mm in diameter. The polyp measures 2.1 mm in diameter. There is pericholecystic fluid. The gallbladder wall is top normal at 2.8 mm. No sonographic Murphy's sign could be assessed due to the patient's medicated status. Common bile duct: Diameter: 7.4  mm.  No definite intraluminal stones are observed. Liver: The hepatic echotexture is increased. There is no focal mass nor ductal dilation. IMPRESSION: Gallstones. Mild pericholecystic fluid with top-normal gallbladder wall thickness it. No definite sonographic Murphy's sign but the patient has had pain medication. Mild  dilation of the common bile duct without definite common bile duct stones. Fatty infiltrative change of the liver. Electronically Signed   By: David  Martinique M.D.   On: 05/31/2016 10:15    Anti-infectives: Anti-infectives    Start     Dose/Rate Route Frequency Ordered Stop   05/31/16 1000  cefTRIAXone (ROCEPHIN) 1 g in dextrose 5 % 50 mL IVPB     1 g 100 mL/hr over 30 Minutes Intravenous Every 24 hours 05/31/16 0937         Assessment/Plan S/p laparoscopic cholecystectomy with IOC 06/01/16 Dr. Ninfa Linden - POD 1  - bilirubin 0.8 normal - WBC up 16.9, afebrile - patient feeling well, tolerating diet, ambulating, and passing flatus  FEN - heart healthy VTE - SCDs  Plan - patient ready for discharge from general surgery standpoint. She will follow-up with Dr. Ninfa Linden in 2-3 weeks.    LOS: 2 days    Jerrye Beavers , [facility] Surgery 06/02/2016, 9:13 AM Pager: [phone] Consults: [phone] Mon-Fri 7:00 am-4:30 pm Sat-Sun 7:00 am-11:30 am

## 2016-06-03 ENCOUNTER — Encounter (HOSPITAL_COMMUNITY): Payer: Self-pay

## 2016-06-03 ENCOUNTER — Emergency Department (HOSPITAL_COMMUNITY)
Admission: EM | Admit: 2016-06-03 | Discharge: 2016-06-04 | Disposition: A | Discharge status: Home / Self Care | Payer: PPO | Attending: Emergency Medicine | Admitting: Emergency Medicine

## 2016-06-03 DIAGNOSIS — Z9049 Acquired absence of other specified parts of digestive tract: Secondary | ICD-10-CM | POA: Insufficient documentation

## 2016-06-03 DIAGNOSIS — M79605 Pain in left leg: Secondary | ICD-10-CM | POA: Diagnosis not present

## 2016-06-03 DIAGNOSIS — Z85828 Personal history of other malignant neoplasm of skin: Secondary | ICD-10-CM | POA: Diagnosis not present

## 2016-06-03 NOTE — ED Triage Notes (Signed)
Pt states that she had gallbladder removed this week and today started having L leg pain behind her knee. Pt called doctor and told her to come get evaluated for a blood clot. No redness, swelling or warmth noted.

## 2016-06-04 ENCOUNTER — Ambulatory Visit (HOSPITAL_BASED_OUTPATIENT_CLINIC_OR_DEPARTMENT_OTHER)
Admission: RE | Admit: 2016-06-04 | Discharge: 2016-06-04 | Disposition: A | Discharge status: Home / Self Care | Payer: PPO | Source: Ambulatory Visit | Attending: Emergency Medicine | Admitting: Emergency Medicine

## 2016-06-04 DIAGNOSIS — M79605 Pain in left leg: Secondary | ICD-10-CM | POA: Insufficient documentation

## 2016-06-04 DIAGNOSIS — M79609 Pain in unspecified limb: Secondary | ICD-10-CM

## 2016-06-04 DIAGNOSIS — Z9889 Other specified postprocedural states: Secondary | ICD-10-CM | POA: Insufficient documentation

## 2016-06-04 MED ORDER — ENOXAPARIN SODIUM 80 MG/0.8ML ~~LOC~~ SOLN
1.0000 mg/kg | Freq: Once | SUBCUTANEOUS | Status: AC
  Administered 2016-06-04: 75 mg via SUBCUTANEOUS
  Filled 2016-06-04: qty 0.8

## 2016-06-04 NOTE — Progress Notes (Signed)
VASCULAR LAB PRELIMINARY  PRELIMINARY  PRELIMINARY  PRELIMINARY  Left lower extremity venous duplex completed.    Preliminary report:  Left:  No evidence of DVT, superficial thrombosis, or Baker's cyst.  Ziyon Soltau, RVS 06/04/2016, 8:51 AM

## 2016-06-04 NOTE — ED Provider Notes (Signed)
[facility] DEPT Provider Note   CSN: [id number] Arrival date & time: 06/03/16  2038     History   Chief Complaint Chief Complaint  Patient presents with  . Leg Pain    HPI [name] is a 65 y.o. female.  HPI  This is a 65 year old female with a recent history of gallstone pancreatitis and cholecystectomy on Monday who presents with left leg pain. Patient reports that she is doing well from a postoperative standpoint. Reports minimal abdominal pain and performing her ADLs normally. However, she woke up this morning having left leg pain which got progressively worse. It is worse with ambulation. She's not noted any swelling. The pain is behind her left knee. No fevers. She denies pain at this time as she is not moving. Denies chest pain or shortness of breath.  Past Medical History:  Diagnosis Date  . Allergic rhinitis   . Anxiety   . Depression   . Diverticulosis    HEMMORHOIDS ON COLONOSCOPY  . IBS (irritable bowel syndrome)    DIARRHEA PRONE  . OA (osteoarthritis)    HANDS  . Skin cancer, basal cell    HX skin cancer per patient  . TMJ syndrome   . Vitamin D deficiency     Patient Active Problem List   Diagnosis Date Noted  . Acute pancreatitis 05/31/2016  . Pancreatitis 05/31/2016  . Nausea and vomiting 05/31/2016  . Leukocytosis 05/31/2016  . Hyperglycemia 05/31/2016    Past Surgical History:  Procedure Laterality Date  . BONE SPURS REMOVAL     TOES  . BREAST BIOPSY     RIGHT  . BREAST ENHANCEMENT SURGERY     THEN REMOVED  . CHOLECYSTECTOMY N/A 06/01/2016   Procedure: LAPAROSCOPIC CHOLECYSTECTOMY WITH INTRAOPERATIVE CHOLANGIOGRAM;  Surgeon: Coralie Keens, MD;  Location: [place];  Service: General;  Laterality: N/A;  . FOOT SURGERY     Right  . SHOULDER SURGERY     RIGHT  . TUBAL LIGATION    . VESICOVAGINAL FISTULA CLOSURE W/ TAH     WITHOUT OOPHS    OB History    No data available       Home Medications    Prior to Admission  medications   Medication Sig Start Date End Date Taking? Authorizing Provider  Cholecalciferol (VITAMIN D) 2000 UNITS CAPS Take 5,000 Units by mouth daily.     Historical Provider, MD  ondansetron (ZOFRAN ODT) 4 MG disintegrating tablet 4mg  ODT q4 hours prn nausea/vomit 10/31/14   Milton Ferguson, MD  Polyvinyl Alcohol-Povidone (REFRESH OP) Apply 1-2 drops to eye daily as needed (dry eyes).    Historical Provider, MD  traMADol (ULTRAM) 50 MG tablet Take 1 tablet (50 mg total) by mouth every 6 (six) hours as needed for moderate pain. 06/02/16   Geradine Girt, DO    Family History Family History  Problem Relation Age of Onset  . Hypertension Mother   . Cancer - Other Mother   . Lung cancer Father   . Cancer - Other Brother   . CVA Brother   . Heart attack Brother   . AAA (abdominal aortic aneurysm) Sister   . Depression Sister     Social History Social History  Substance Use Topics  . Smoking status: Never Smoker  . Smokeless tobacco: Never Used  . Alcohol use No     Allergies   Ketorolac tromethamine; Morphine and related; Percocet [oxycodone-acetaminophen]; and Zoloft [sertraline hcl]   Review of Systems Review of Systems  Constitutional: Negative for fever.  Respiratory: Negative for shortness of breath.   Cardiovascular: Negative for chest pain and leg swelling.  Gastrointestinal: Negative for abdominal pain, nausea and vomiting.  Musculoskeletal:       Left leg pain  Skin: Negative for color change.  All other systems reviewed and are negative.    Physical Exam Updated Vital Signs BP 119/80 (BP Location: Right Arm)   Pulse 92   Temp 98.4 F (36.9 C) (Oral)   Resp 16   SpO2 99%   Physical Exam  Constitutional: She is oriented to person, place, and time. She appears well-developed and well-nourished.  HENT:  Head: Normocephalic and atraumatic.  Cardiovascular: Normal rate, regular rhythm and normal heart sounds.   Pulmonary/Chest: Effort normal and breath  sounds normal. No respiratory distress. She has no wheezes.  Abdominal: Soft. Bowel sounds are normal. There is no tenderness. There is no guarding.  Multiple laparoscopic incisions clean dry and intact, adjacent bruising noted  Musculoskeletal: She exhibits no edema, tenderness or deformity.  No tenderness to palpation, edema, significant overlying skin changes noted to the left leg, 2+ DP pulse  Neurological: She is alert and oriented to person, place, and time.  Skin: Skin is warm and dry.  Psychiatric: She has a normal mood and affect.  Nursing note and vitals reviewed.    ED Treatments / Results  Labs (all labs ordered are listed, but only abnormal results are displayed) Labs Reviewed - No data to display  EKG  EKG Interpretation None       Radiology No results found.  Procedures Procedures (including critical care time)  Medications Ordered in ED Medications  enoxaparin (LOVENOX) injection 75 mg (75 mg Subcutaneous Given 06/04/16 0053)     Initial Impression / Assessment and Plan / ED Course  I have reviewed the triage vital signs and the nursing notes.  Pertinent labs & imaging results that were available during my care of the patient were reviewed by me and considered in my medical decision making (see chart for details).  Clinical Course    Patient presents with left leg pain. Recent cholecystectomy. Otherwise nontoxic. Was instructed to be evaluated for a blood clot. Clinically low suspicion; however, she is a risk given recent surgery. Lovenox okayed by general surgery, Dr. Barry Dienes. Patient was given 1 dose of Lovenox. Will discharge in order ultrasound for tomorrow morning.  After history, exam, and medical workup I feel the patient has been appropriately medically screened and is safe for discharge home. Pertinent diagnoses were discussed with the patient. Patient was given return precautions.   Final Clinical Impressions(s) / ED Diagnoses   Final  diagnoses:  Left leg pain  Hx of cholecystectomy    New Prescriptions New Prescriptions   No medications on file     Merryl Hacker, MD 06/04/16 [phone]

## 2016-06-04 NOTE — Discharge Instructions (Signed)
You were seen today for leg pain. You need to return in the morning for an ultrasound of your left leg.  See instructions provided.

## 2016-06-07 ENCOUNTER — Emergency Department (HOSPITAL_COMMUNITY)
Admission: EM | Admit: 2016-06-07 | Discharge: 2016-06-07 | Disposition: A | Discharge status: Home / Self Care | Payer: PPO | Attending: Emergency Medicine | Admitting: Emergency Medicine

## 2016-06-07 ENCOUNTER — Encounter (HOSPITAL_COMMUNITY): Payer: Self-pay | Admitting: *Deleted

## 2016-06-07 ENCOUNTER — Emergency Department (HOSPITAL_COMMUNITY): Payer: PPO

## 2016-06-07 DIAGNOSIS — Z85828 Personal history of other malignant neoplasm of skin: Secondary | ICD-10-CM | POA: Insufficient documentation

## 2016-06-07 DIAGNOSIS — M79641 Pain in right hand: Secondary | ICD-10-CM | POA: Diagnosis not present

## 2016-06-07 DIAGNOSIS — M25511 Pain in right shoulder: Secondary | ICD-10-CM

## 2016-06-07 DIAGNOSIS — R7989 Other specified abnormal findings of blood chemistry: Secondary | ICD-10-CM

## 2016-06-07 DIAGNOSIS — R945 Abnormal results of liver function studies: Secondary | ICD-10-CM | POA: Insufficient documentation

## 2016-06-07 LAB — URINALYSIS, ROUTINE W REFLEX MICROSCOPIC
BILIRUBIN URINE: NEGATIVE
GLUCOSE, UA: NEGATIVE mg/dL
Ketones, ur: 15 mg/dL — AB
Nitrite: NEGATIVE
PROTEIN: NEGATIVE mg/dL
SPECIFIC GRAVITY, URINE: 1.022 (ref 1.005–1.030)
pH: 6 (ref 5.0–8.0)

## 2016-06-07 LAB — COMPREHENSIVE METABOLIC PANEL
ALBUMIN: 3.1 g/dL — AB (ref 3.5–5.0)
ALK PHOS: 252 U/L — AB (ref 38–126)
ALT: 199 U/L — AB (ref 14–54)
AST: 76 U/L — AB (ref 15–41)
Anion gap: 10 (ref 5–15)
BUN: <5 mg/dL — ABNORMAL LOW (ref 6–20)
CALCIUM: 9.6 mg/dL (ref 8.9–10.3)
CO2: 25 mmol/L (ref 22–32)
CREATININE: 0.58 mg/dL (ref 0.44–1.00)
Chloride: 106 mmol/L (ref 101–111)
GFR calc Af Amer: >60 mL/min (ref >60)
GFR calc non Af Amer: >60 mL/min (ref >60)
GLUCOSE: 105 mg/dL — AB (ref 65–99)
Potassium: 3.3 mmol/L — ABNORMAL LOW (ref 3.5–5.1)
SODIUM: 141 mmol/L (ref 135–145)
Total Bilirubin: 0.7 mg/dL (ref 0.3–1.2)
Total Protein: 6.2 g/dL — ABNORMAL LOW (ref 6.5–8.1)

## 2016-06-07 LAB — URINE MICROSCOPIC-ADD ON

## 2016-06-07 LAB — CBC
HCT: 36.8 % (ref 36.0–46.0)
Hemoglobin: 11.9 g/dL — ABNORMAL LOW (ref 12.0–15.0)
MCH: 28.1 pg (ref 26.0–34.0)
MCHC: 32.3 g/dL (ref 30.0–36.0)
MCV: 87 fL (ref 78.0–100.0)
PLATELETS: 344 10*3/uL (ref 150–400)
RBC: 4.23 MIL/uL (ref 3.87–5.11)
RDW: 13.6 % (ref 11.5–15.5)
WBC: 11.7 10*3/uL — ABNORMAL HIGH (ref 4.0–10.5)

## 2016-06-07 LAB — LIPASE, BLOOD: Lipase: 18 U/L (ref 11–51)

## 2016-06-07 MED ORDER — SODIUM CHLORIDE 0.9 % IV BOLUS (SEPSIS)
500.0000 mL | Freq: Once | INTRAVENOUS | Status: AC
  Administered 2016-06-07: 500 mL via INTRAVENOUS

## 2016-06-07 NOTE — ED Provider Notes (Signed)
[facility] DEPT Provider Note   CSN: [id number] Arrival date & time: 06/07/16  [id number]     History   Chief Complaint Chief Complaint  Patient presents with  . Shoulder Pain    post cholecystectomy    HPI ELCIE SITLER is a 65 y.o. female.  HPI Patient had cholecystectomy performed 1 week ago by Dr. Ninfa Linden. States since that time she's had right shoulder and right hand pain. She's had episodic weakness in her left leg. She states this is resolved. She also has episodic abdominal pain but currently denies any. No fever or chills. No nausea or vomiting. States she had a large bowel movement on Saturday. Past Medical History:  Diagnosis Date  . Allergic rhinitis   . Anxiety   . Depression   . Diverticulosis    HEMMORHOIDS ON COLONOSCOPY  . IBS (irritable bowel syndrome)    DIARRHEA PRONE  . OA (osteoarthritis)    HANDS  . Skin cancer, basal cell    HX skin cancer per patient  . TMJ syndrome   . Vitamin D deficiency     Patient Active Problem List   Diagnosis Date Noted  . Acute pancreatitis 05/31/2016  . Pancreatitis 05/31/2016  . Nausea and vomiting 05/31/2016  . Leukocytosis 05/31/2016  . Hyperglycemia 05/31/2016    Past Surgical History:  Procedure Laterality Date  . BONE SPURS REMOVAL     TOES  . BREAST BIOPSY     RIGHT  . BREAST ENHANCEMENT SURGERY     THEN REMOVED  . CHOLECYSTECTOMY N/A 06/01/2016   Procedure: LAPAROSCOPIC CHOLECYSTECTOMY WITH INTRAOPERATIVE CHOLANGIOGRAM;  Surgeon: Coralie Keens, MD;  Location: [facility];  Service: General;  Laterality: N/A;  . FOOT SURGERY     Right  . SHOULDER SURGERY     RIGHT  . TUBAL LIGATION    . VESICOVAGINAL FISTULA CLOSURE W/ TAH     WITHOUT OOPHS    OB History    No data available       Home Medications    Prior to Admission medications   Medication Sig Start Date End Date Taking? Authorizing Provider  Cholecalciferol (VITAMIN D) 2000 UNITS CAPS Take 5,000 Units by mouth daily.      Historical Provider, MD  ondansetron (ZOFRAN ODT) 4 MG disintegrating tablet 4mg  ODT q4 hours prn nausea/vomit 10/31/14   Milton Ferguson, MD  Polyvinyl Alcohol-Povidone (REFRESH OP) Apply 1-2 drops to eye daily as needed (dry eyes).    Historical Provider, MD  traMADol (ULTRAM) 50 MG tablet Take 1 tablet (50 mg total) by mouth every 6 (six) hours as needed for moderate pain. 06/02/16   Geradine Girt, DO    Family History Family History  Problem Relation Age of Onset  . Hypertension Mother   . Cancer - Other Mother   . Lung cancer Father   . Cancer - Other Brother   . CVA Brother   . Heart attack Brother   . AAA (abdominal aortic aneurysm) Sister   . Depression Sister     Social History Social History  Substance Use Topics  . Smoking status: Never Smoker  . Smokeless tobacco: Never Used  . Alcohol use No     Allergies   Ketorolac tromethamine; Morphine and related; Percocet [oxycodone-acetaminophen]; and Zoloft [sertraline hcl]   Review of Systems Review of Systems  Constitutional: Negative for chills and fever.  Respiratory: Negative for shortness of breath.   Cardiovascular: Negative for chest pain and palpitations.  Gastrointestinal: Positive for abdominal  pain. Negative for constipation, diarrhea, nausea and vomiting.  Genitourinary: Negative for dysuria, flank pain and frequency.  Musculoskeletal: Positive for arthralgias. Negative for back pain, joint swelling, myalgias, neck pain and neck stiffness.  Skin: Negative for rash and wound.  Neurological: Positive for weakness. Negative for dizziness, light-headedness, numbness and headaches.  All other systems reviewed and are negative.    Physical Exam Updated Vital Signs BP (!) 119/51 (BP Location: Left Arm)   Pulse 84   Temp 98.4 F (36.9 C) (Oral)   Resp 16   Ht 5\' 4"  (1.626 m)   Wt 157 lb (71.2 kg)   SpO2 98%   BMI 26.95 kg/m   Physical Exam  Constitutional: She is oriented to person, place, and time.  She appears well-developed and well-nourished.  HENT:  Head: Normocephalic and atraumatic.  Mouth/Throat: Oropharynx is clear and moist.  Eyes: EOM are normal. Pupils are equal, round, and reactive to light.  Neck: Normal range of motion. Neck supple.  Cardiovascular: Normal rate and regular rhythm.   Pulmonary/Chest: Effort normal and breath sounds normal.  Abdominal: Soft. Bowel sounds are normal. There is no tenderness. There is no rebound and no guarding.  Abdominal surgical wounds appear well healing.  Musculoskeletal: Normal range of motion. She exhibits no edema or tenderness.  Patient has tenderness to palpation of the lateral surface of the right deltoid. There is no erythema, warmth or swelling. She also has tenderness to palpation over the ulnar styloid process of the right wrist and hand. No obvious swelling, erythema or limitations of range of motion. Distal pulses are 2+. No CVA tenderness bilaterally. No midline thoracic or lumbar tenderness.  Neurological: She is alert and oriented to person, place, and time.  5/5 motor in all extremities. Sensation is fully intact.  Skin: Skin is warm and dry. Capillary refill takes less than 2 seconds. No rash noted. No erythema.  Psychiatric: She has a normal mood and affect. Her behavior is normal.  Nursing note and vitals reviewed.    ED Treatments / Results  Labs (all labs ordered are listed, but only abnormal results are displayed) Labs Reviewed  COMPREHENSIVE METABOLIC PANEL - Abnormal; Notable for the following:       Result Value   Potassium 3.3 (*)    Glucose, Bld 105 (*)    BUN <5 (*)    Total Protein 6.2 (*)    Albumin 3.1 (*)    AST 76 (*)    ALT 199 (*)    Alkaline Phosphatase 252 (*)    All other components within normal limits  CBC - Abnormal; Notable for the following:    WBC 11.7 (*)    Hemoglobin 11.9 (*)    All other components within normal limits  URINALYSIS, ROUTINE W REFLEX MICROSCOPIC (NOT AT [facility]) -  Abnormal; Notable for the following:    Color, Urine AMBER (*)    Hgb urine dipstick LARGE (*)    Ketones, ur 15 (*)    Leukocytes, UA SMALL (*)    All other components within normal limits  URINE MICROSCOPIC-ADD ON - Abnormal; Notable for the following:    Squamous Epithelial / LPF 6-30 (*)    Bacteria, UA FEW (*)    All other components within normal limits  LIPASE, BLOOD    EKG  EKG Interpretation None       Radiology Dg Shoulder Right  Result Date: 06/07/2016 CLINICAL DATA:  Right shoulder arthralgias, no injury. EXAM: RIGHT SHOULDER - 2+  VIEW COMPARISON:  None. FINDINGS: No acute osseous or joint abnormality. Subacromial spur with degenerative changes in the right acromioclavicular joint. Visualized portion of the right chest is unremarkable. IMPRESSION: Right acromioclavicular joint osteoarthritis and subacromial spur. Electronically Signed   By: Lorin Picket M.D.   On: 06/07/2016 13:12   US Abdomen Limited  Result Date: 06/07/2016 CLINICAL DATA:  Post cholecystectomy with abnormal LFTs. EXAM: US ABDOMEN LIMITED - RIGHT UPPER QUADRANT COMPARISON:  05/31/2016 FINDINGS: Gallbladder: Surgically removed. Common bile duct: Diameter: 8 mm and previously measured 7 mm. Liver: Liver parenchyma has slightly increased echogenicity. No focal liver lesion. No intrahepatic biliary dilatation. Main portal venous system is patent. IMPRESSION: No acute abnormality. Post cholecystectomy.  No significant biliary dilatation. Electronically Signed   By: Markus Daft M.D.   On: 06/07/2016 13:06   Dg Hand Complete Right  Result Date: 06/07/2016 CLINICAL DATA:  Three day history of right hand pain. EXAM: RIGHT HAND - COMPLETE 3+ VIEW COMPARISON:  No comparison studies available. FINDINGS: Degenerative changes most advanced in the DIP joints. Bones are demineralized. No fracture. No subluxation or dislocation. IMPRESSION: No acute bony findings. Electronically Signed   By: Misty Stanley M.D.   On:  06/07/2016 13:13    Procedures Procedures (including critical care time)  Medications Ordered in ED Medications  sodium chloride 0.9 % bolus 500 mL (500 mLs Intravenous New Bag/Given 06/07/16 1219)     Initial Impression / Assessment and Plan / ED Course  I have reviewed the triage vital signs and the nursing notes.  Pertinent labs & imaging results that were available during my care of the patient were reviewed by me and considered in my medical decision making (see chart for details).  Clinical Course    Discussed with general surgery who reviewed the patient's labs and medical records. Advised follow-up with primary physician for repeat LFTs in 3 days. Patient's abdominal exam is benign. Suspect arthralgia to the right shoulder and wrist due to positioning during surgery and flare of chronic arthritis. Return precautions given.  Final Clinical Impressions(s) / ED Diagnoses   Final diagnoses:  Arthralgia of right shoulder region  Elevated LFTs    New Prescriptions New Prescriptions   No medications on file     Julianne Rice, MD 06/07/16 1433

## 2016-06-07 NOTE — ED Notes (Signed)
Pt transported to US

## 2016-06-07 NOTE — ED Triage Notes (Signed)
Pt states R shoulder pain post cholecystectomy on Thursday.

## 2016-06-08 DIAGNOSIS — M25531 Pain in right wrist: Secondary | ICD-10-CM | POA: Diagnosis not present

## 2016-06-08 DIAGNOSIS — K851 Biliary acute pancreatitis without necrosis or infection: Secondary | ICD-10-CM | POA: Diagnosis not present

## 2016-06-08 DIAGNOSIS — R740 Nonspecific elevation of levels of transaminase and lactic acid dehydrogenase [LDH]: Secondary | ICD-10-CM | POA: Diagnosis not present

## 2016-06-08 DIAGNOSIS — M25511 Pain in right shoulder: Secondary | ICD-10-CM | POA: Diagnosis not present

## 2016-06-10 DIAGNOSIS — R740 Nonspecific elevation of levels of transaminase and lactic acid dehydrogenase [LDH]: Secondary | ICD-10-CM | POA: Diagnosis not present

## 2016-06-17 DIAGNOSIS — M7551 Bursitis of right shoulder: Secondary | ICD-10-CM | POA: Diagnosis not present

## 2016-06-23 DIAGNOSIS — Z85828 Personal history of other malignant neoplasm of skin: Secondary | ICD-10-CM | POA: Diagnosis not present

## 2016-06-23 DIAGNOSIS — Z08 Encounter for follow-up examination after completed treatment for malignant neoplasm: Secondary | ICD-10-CM | POA: Diagnosis not present

## 2016-07-07 DIAGNOSIS — H6980 Other specified disorders of Eustachian tube, unspecified ear: Secondary | ICD-10-CM | POA: Diagnosis not present

## 2016-07-07 DIAGNOSIS — H903 Sensorineural hearing loss, bilateral: Secondary | ICD-10-CM | POA: Diagnosis not present

## 2016-08-06 DIAGNOSIS — H6980 Other specified disorders of Eustachian tube, unspecified ear: Secondary | ICD-10-CM | POA: Diagnosis not present

## 2017-02-02 IMAGING — CT CT ABD-PELV W/O CM
2 of 4 series · 11 of 46 positions shown, 12 images · non-contrast
Comparison: 10/31/2014

CLINICAL DATA: Right flank pain.  Recent imaging for diverticulitis

EXAM:
CT ABDOMEN AND PELVIS WITHOUT CONTRAST
TECHNIQUE: Multidetector CT imaging of the abdomen and pelvis was performed
following the standard protocol without IV contrast.

[Series 201: stone study, idose (2) · axial · 0.79mm/px · z∈[-444,-54]mm · 8 of 94 slices shown, 9 images]
[im 8/94  soft-tissue]
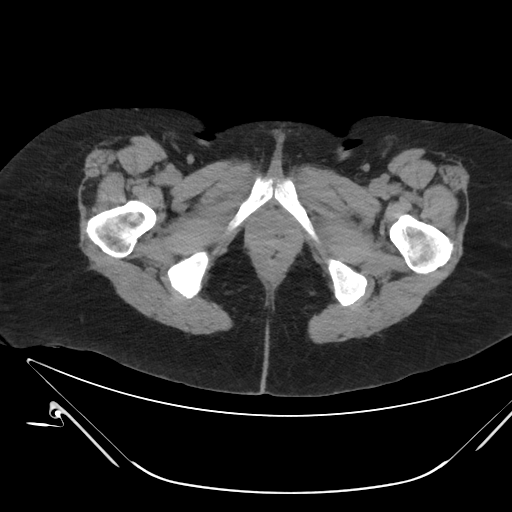
[im 8/94  bone]
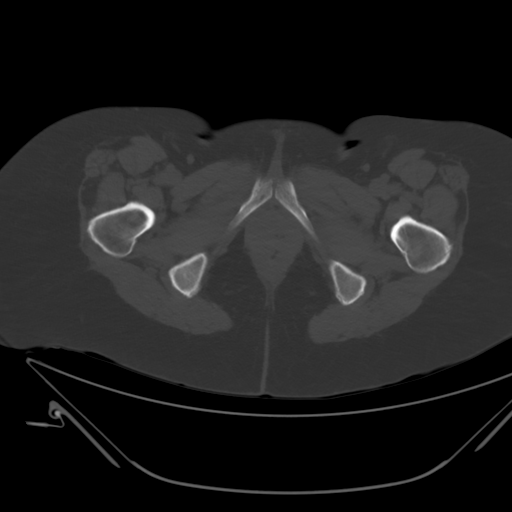
[im 18/94  soft-tissue]
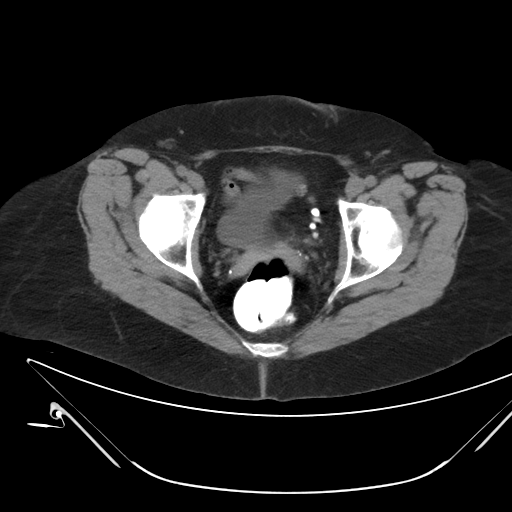
[im 29/94  soft-tissue]
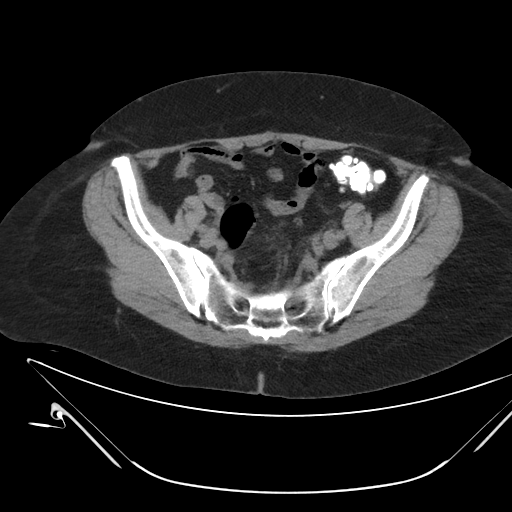
[im 40/94  soft-tissue]
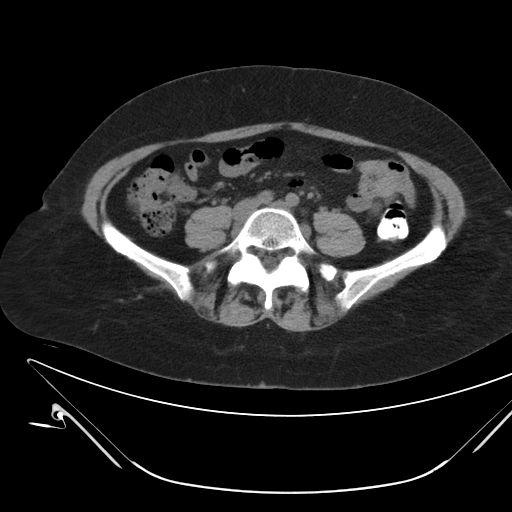
[im 54/94  soft-tissue]
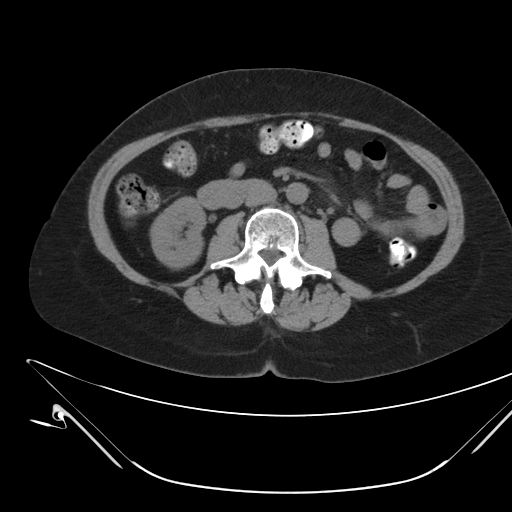
[im 65/94  soft-tissue]
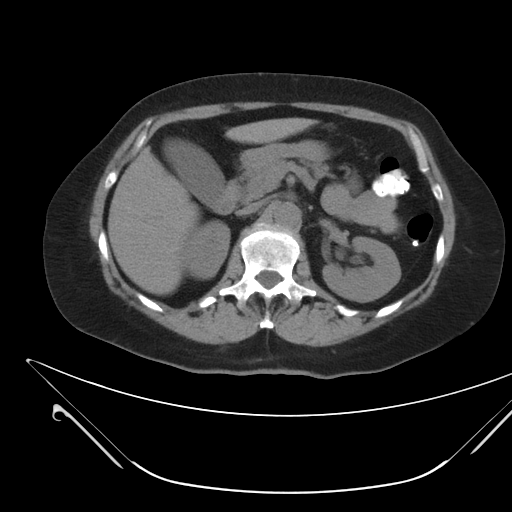
[im 76/94  soft-tissue]
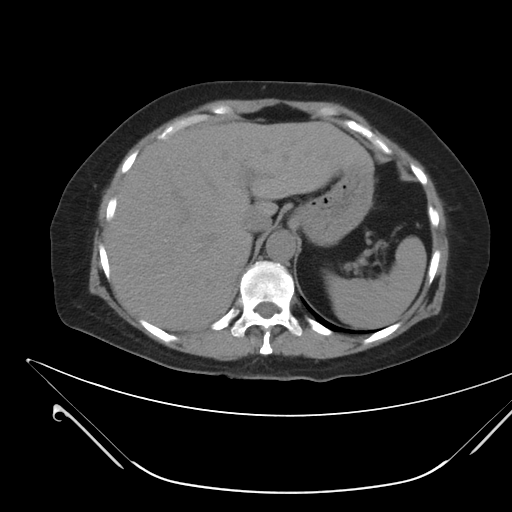
[im 86/94  soft-tissue]
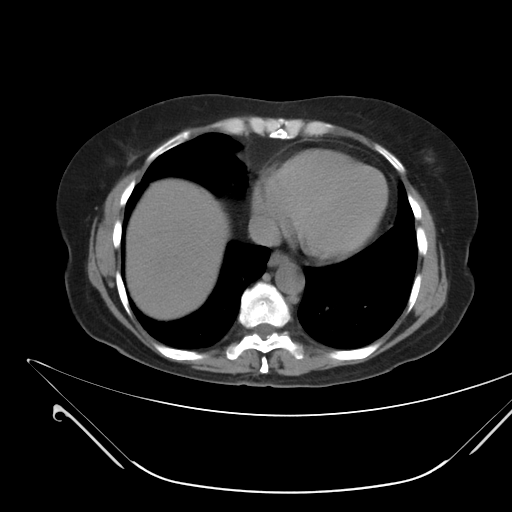

[Series 202: coronals, idose (2) · coronal · 0.45mm/px · 3 of 105 slices shown]
[im 35/105  soft-tissue]
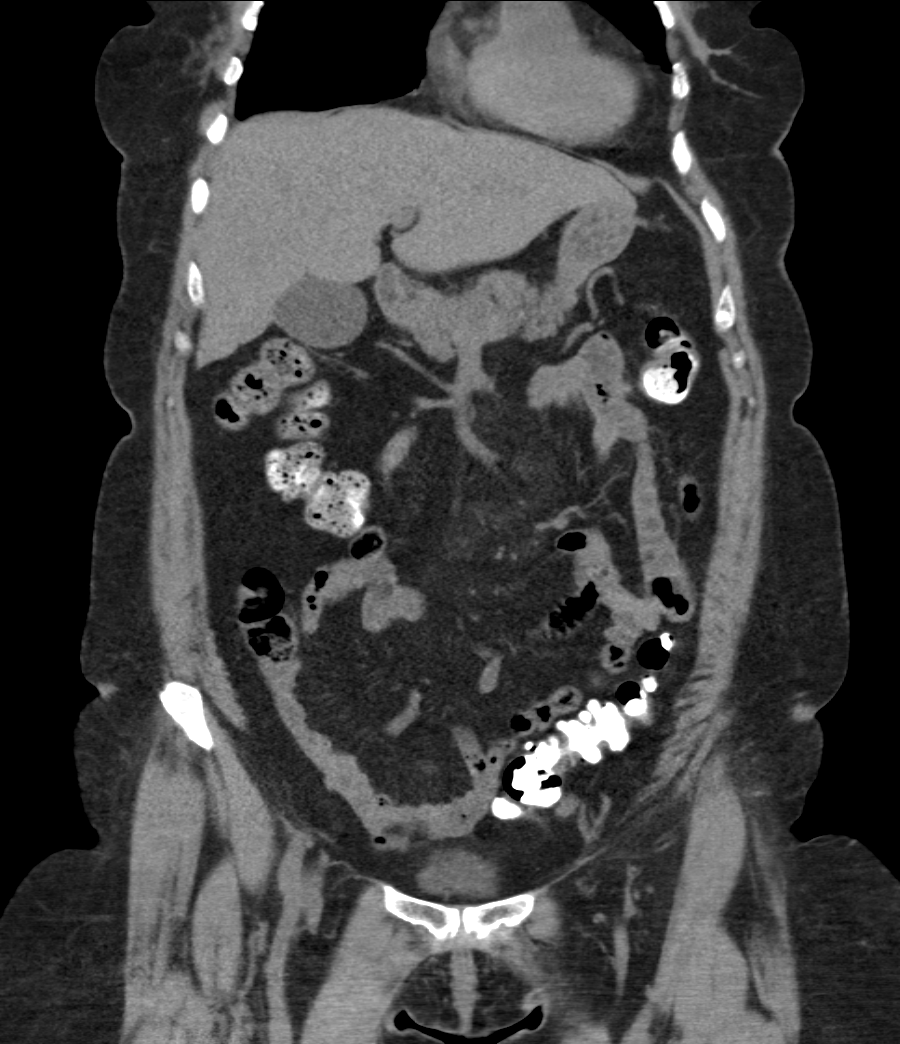
[im 47/105  soft-tissue]
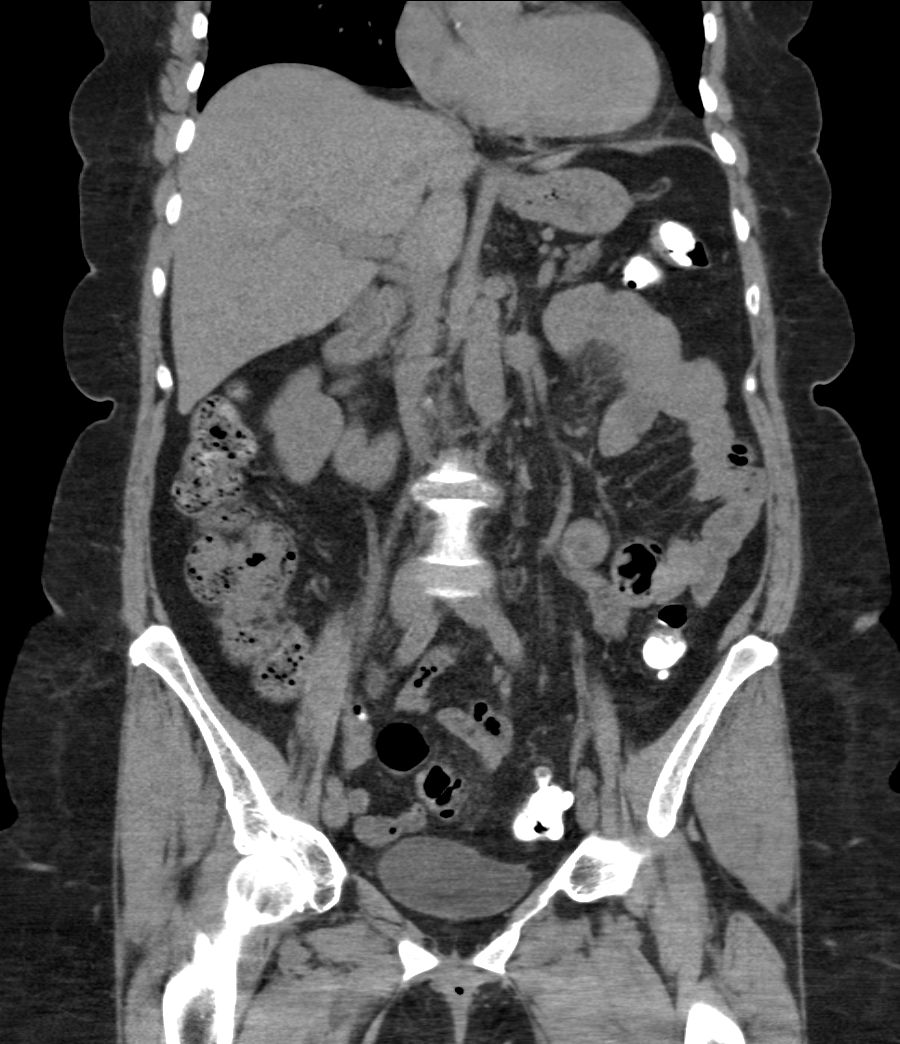
[im 58/105  soft-tissue]
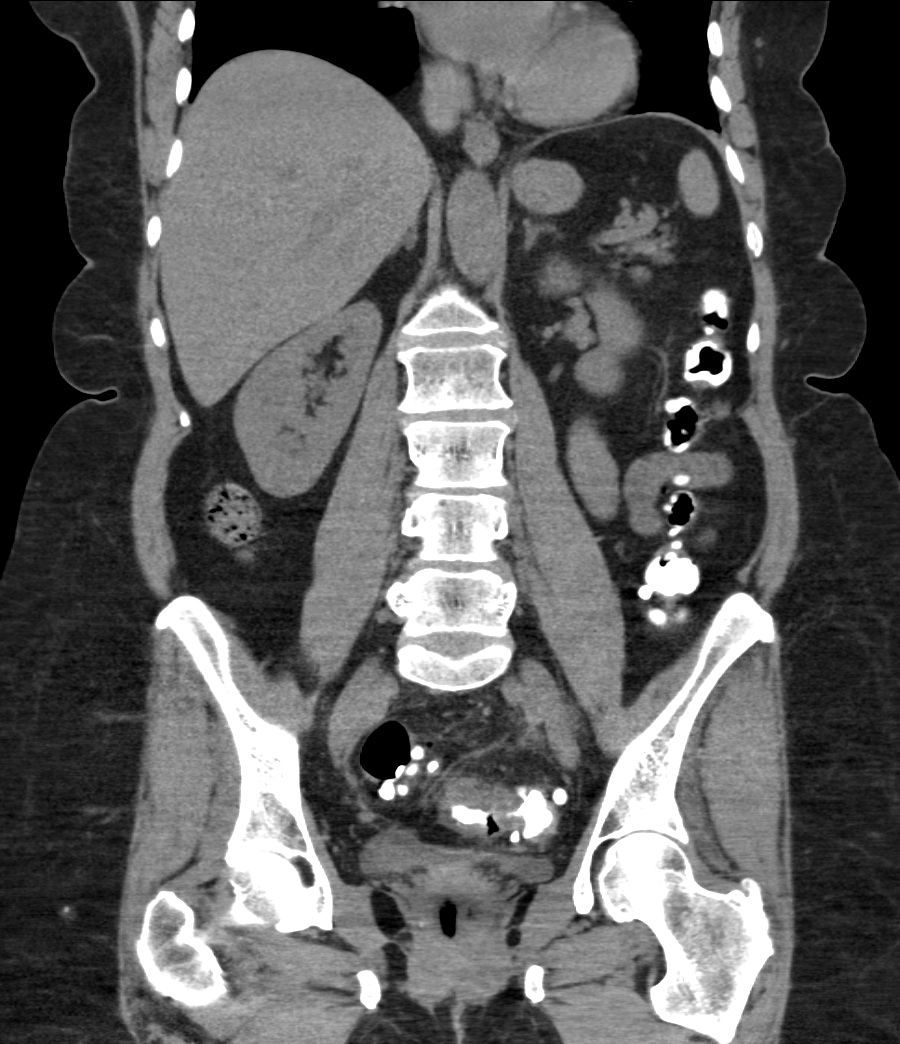

[11 of 46 positions shown; findings below may reference images not displayed]

FINDINGS: BODY WALL: Unremarkable.

LOWER CHEST: Unremarkable.

ABDOMEN/PELVIS:

Liver: No focal abnormality.

Biliary: No evidence of biliary obstruction or stone.

Pancreas: Unremarkable.

Spleen: Unremarkable.

Adrenals: Unremarkable.

Kidneys and ureters: No hydronephrosis or stone.

Bladder: Unremarkable.

Reproductive: Hysterectomy.  The ovaries are negative.

Bowel: Sigmoid diverticulitis is again noted with focal wall
thickening and pericolonic fat infiltration. Oral contrast fills the
segment, showing there has been no interval perforation. No
pneumoperitoneum or abscess. Normal appendix.

Retroperitoneum: No mass or adenopathy.

Peritoneum: No ascites or pneumoperitoneum.

Vascular: No acute abnormality.

OSSEOUS: No acute abnormalities.
IMPRESSION: 1. No explanation for new right flank pain.
2. Sigmoid diverticulitis without perforation or progression since
10/31/2014.

## 2017-02-02 IMAGING — MR MR LUMBAR SPINE W/O CM
4 of 8 series · 19 of 48 positions shown · non-contrast
Comparison: Abdominal pelvic CT 11/02/2014.

CLINICAL DATA: Right flank pain for 3 days. No known injury.
Initial encounter.

EXAM:
MRI LUMBAR SPINE WITHOUT CONTRAST
TECHNIQUE: Multiplanar, multisequence MR imaging of the lumbar spine was
performed. No intravenous contrast was administered.

[Series 4: T2 · sagittal · 4.0mm · 0.55mm/px · 3 of 14 slices shown (1 of 2)]
[im 1/14]
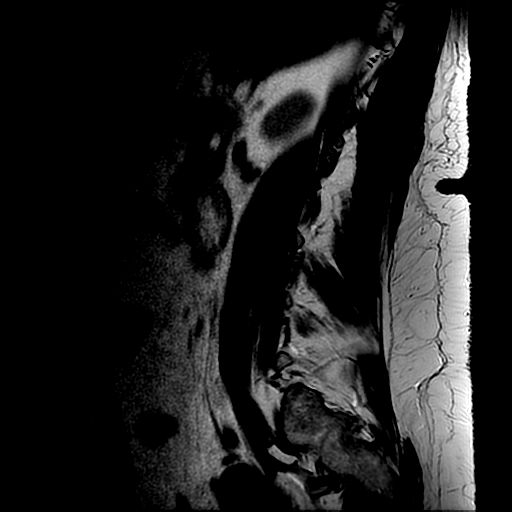
[im 7/14]
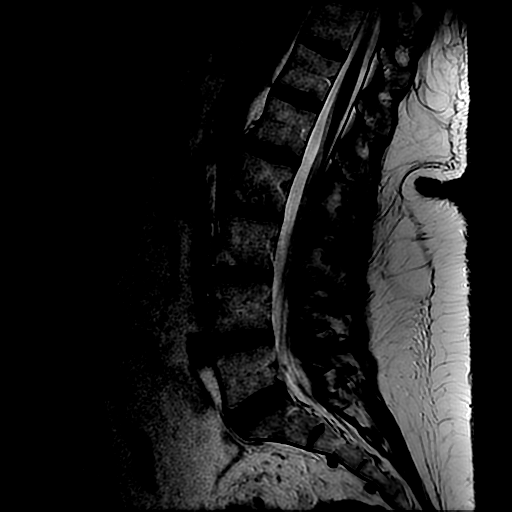
[im 14/14]
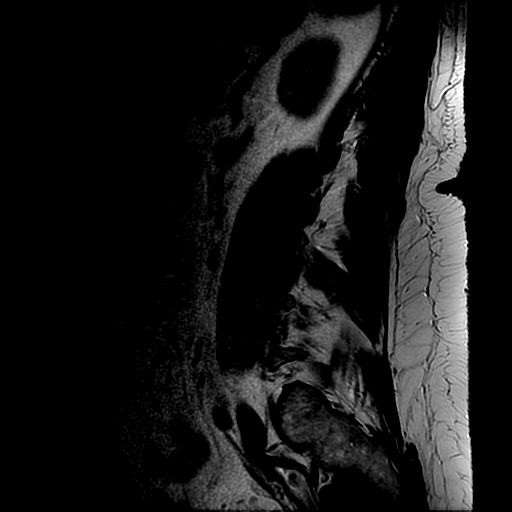

[Series 5: T1 · sagittal · 4.0mm · 0.55mm/px · 3 of 14 slices shown (1 of 2)]
[im 1/14]
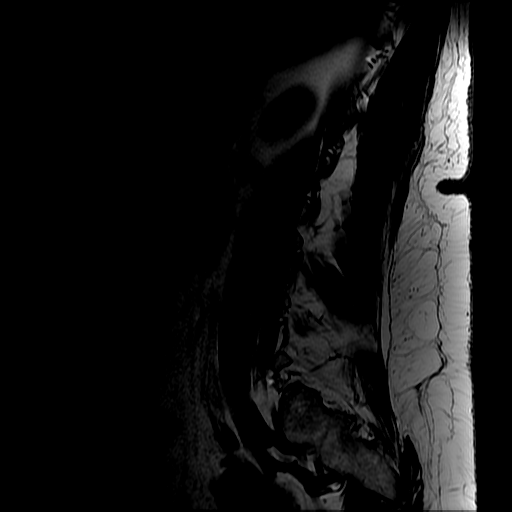
[im 7/14]
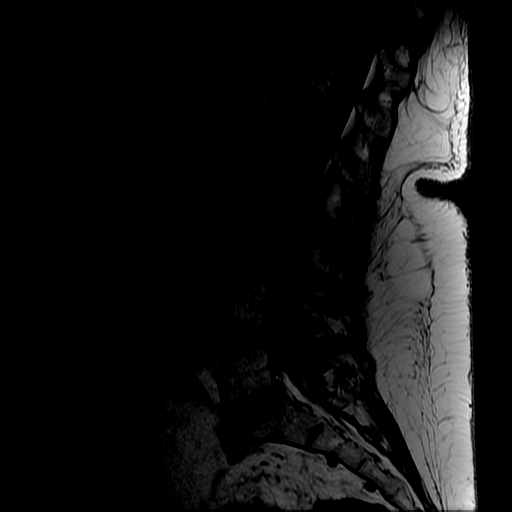
[im 14/14]
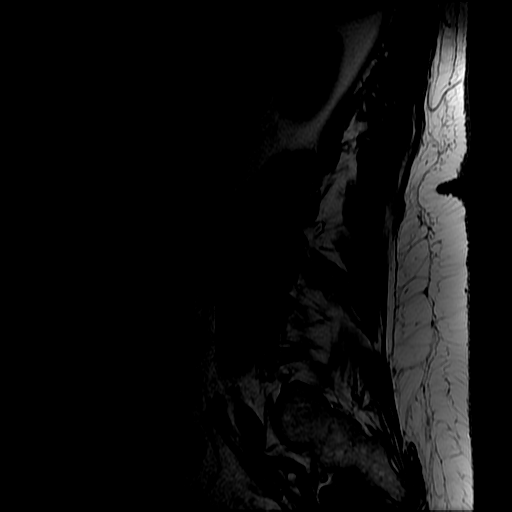

[Series 7: T2 · axial · 5.0mm · 0.39mm/px · z∈[-192,+3]mm · 9 of 40 slices shown (2 of 2)]
[im 1/40]
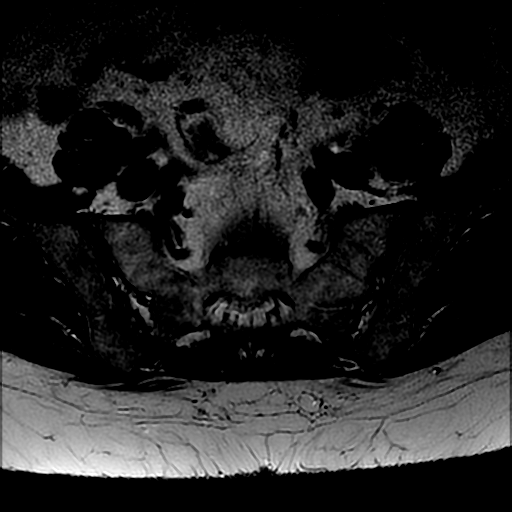
[im 5/40]
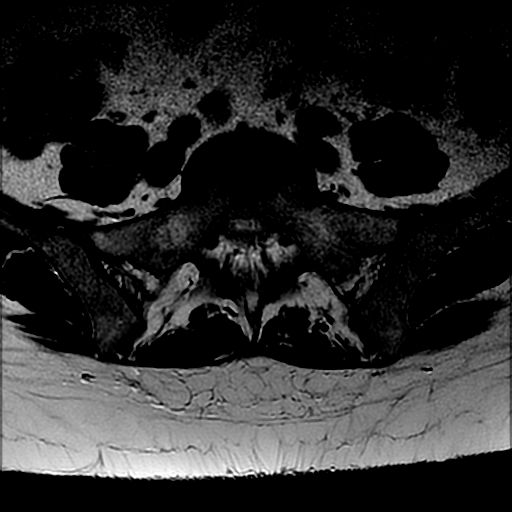
[im 10/40]
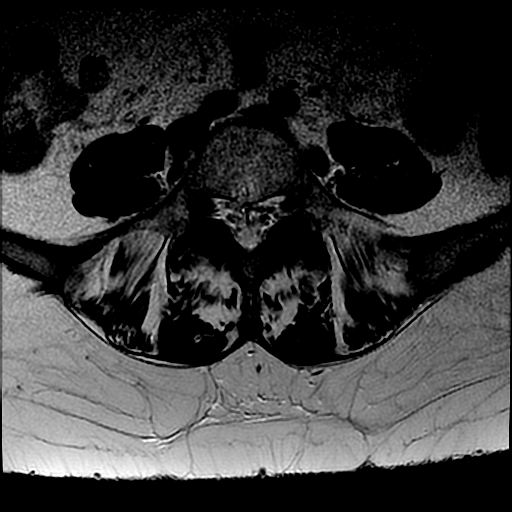
[im 15/40]
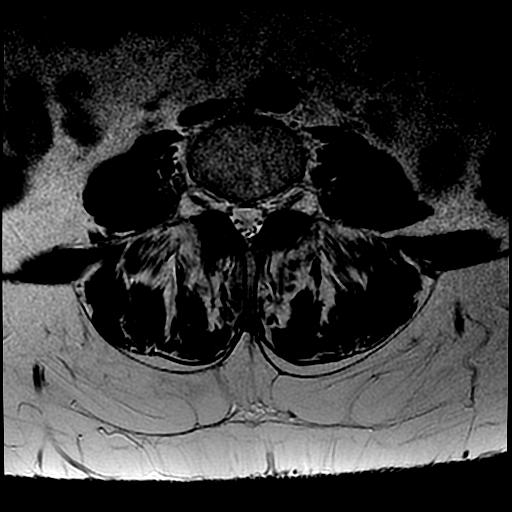
[im 20/40]
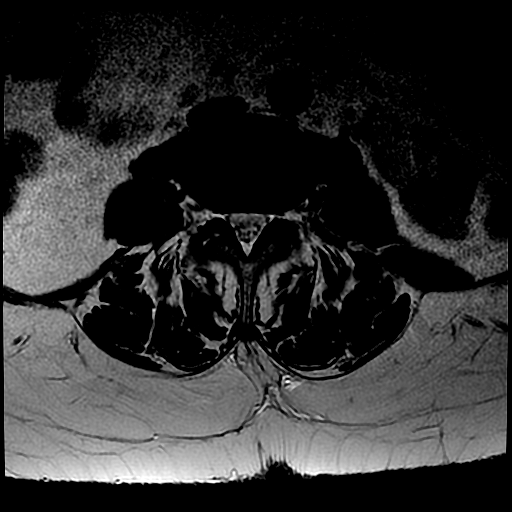
[im 25/40]
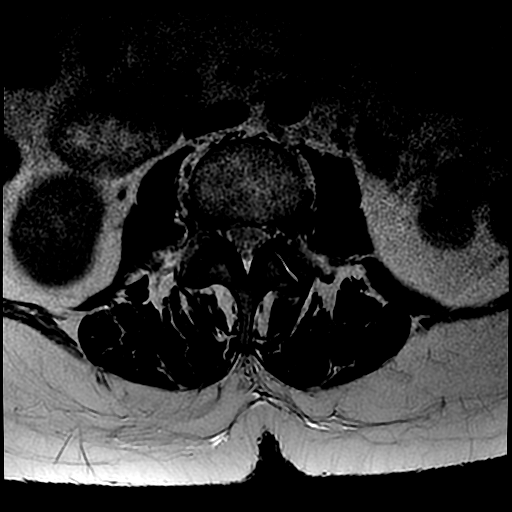
[im 30/40]
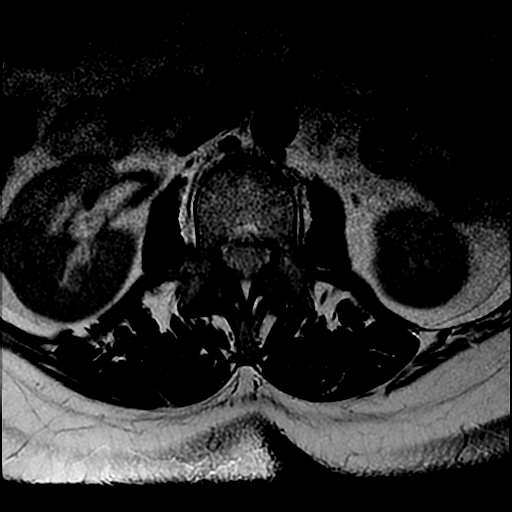
[im 35/40]
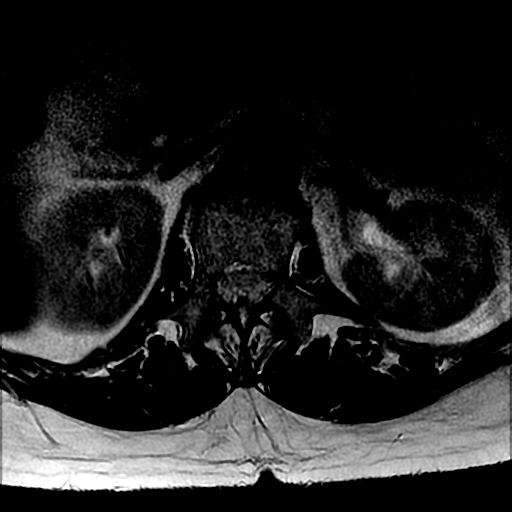
[im 40/40]
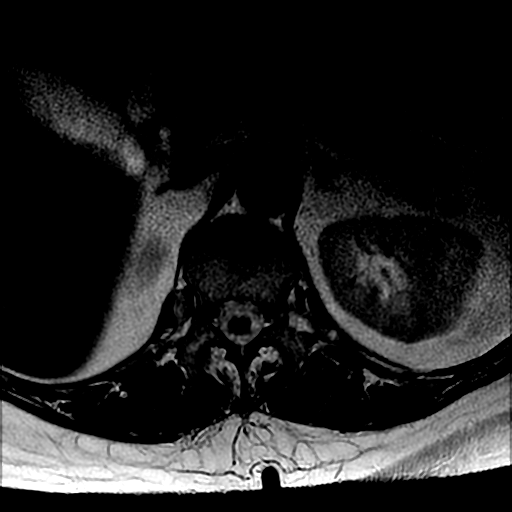

[Series 8: T1 · axial · 5.0mm · 0.39mm/px · z∈[-192,-22]mm · 4 of 40 slices shown (2 of 2)]
[im 1/40]
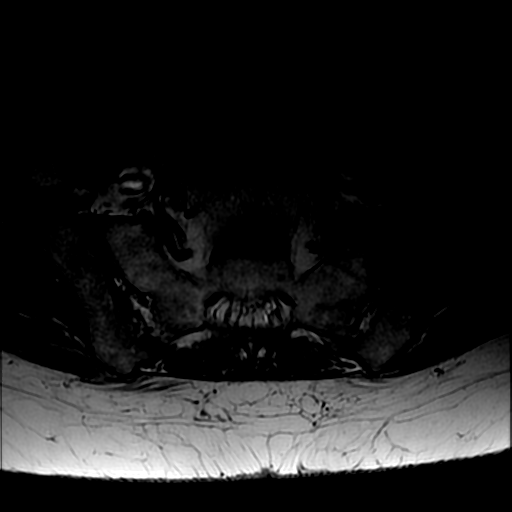
[im 5/40]
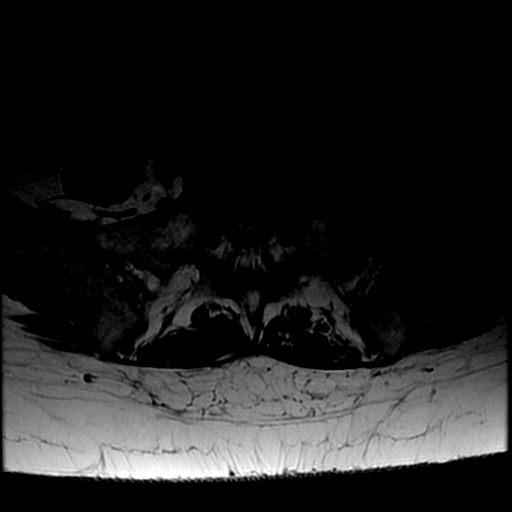
[im 20/40]
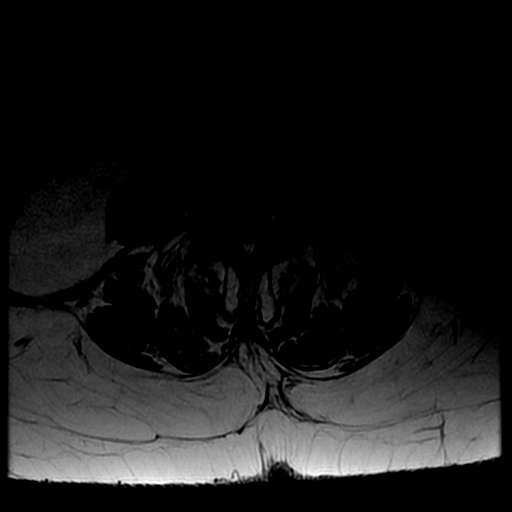
[im 35/40]
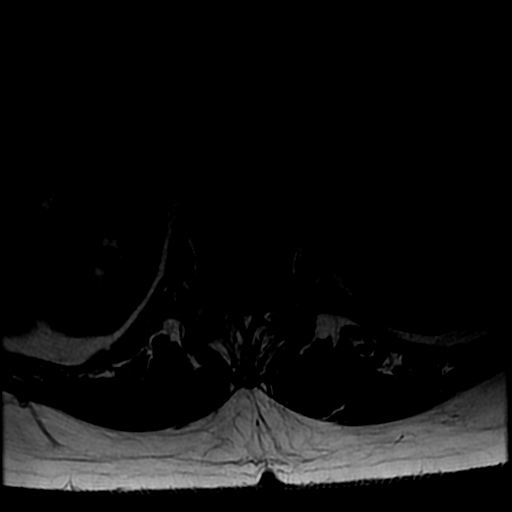

[19 of 48 positions shown; findings below may reference images not displayed]

FINDINGS: CT demonstrates 5 lumbar type vertebral bodies. The alignment is
normal. There is no evidence of fracture or pars defect. Inferior
endplate Schmorl's node formation is present at L2.

The conus medullaris extends to the L1-2 level and appears normal.
No paraspinal abnormalities are identified.

L1-2: Mild disc bulging. No spinal stenosis or nerve root
encroachment.

L2-3: Mild disc bulging and loss of disc height with endplate
degeneration. No spinal stenosis or nerve root encroachment.

L3-4: Mild disc bulging with loss of disc height, facet and
ligamentous hypertrophy. No spinal stenosis or nerve root
encroachment.

L4-5: Shallow left paracentral disc protrusion. Mild facet and
ligamentous hypertrophy. These factors contribute to mild spinal
stenosis with mild narrowing of the lateral recesses. The foramina
are widely patent.

L5-S1: Disc height and hydration are largely maintained. Mild
bilateral facet hypertrophy. No spinal stenosis or nerve root
encroachment.
IMPRESSION: 1. No acute findings or explanation for the patient's symptoms
demonstrated.
2. Mild lumbar spondylosis as described with disc bulging and facet
hypertrophy. Shallow left paracentral disc protrusion at L4-5
contributes to mild central stenosis and mild narrowing of both
lateral recesses. The foramina are widely patent.

## 2017-02-15 DIAGNOSIS — J449 Chronic obstructive pulmonary disease, unspecified: Secondary | ICD-10-CM | POA: Diagnosis not present

## 2017-02-16 DIAGNOSIS — J449 Chronic obstructive pulmonary disease, unspecified: Secondary | ICD-10-CM | POA: Diagnosis not present

## 2017-02-25 DIAGNOSIS — M7130 Other bursal cyst, unspecified site: Secondary | ICD-10-CM | POA: Diagnosis not present

## 2017-02-25 DIAGNOSIS — X32XXXD Exposure to sunlight, subsequent encounter: Secondary | ICD-10-CM | POA: Diagnosis not present

## 2017-02-25 DIAGNOSIS — Z85828 Personal history of other malignant neoplasm of skin: Secondary | ICD-10-CM | POA: Diagnosis not present

## 2017-02-25 DIAGNOSIS — L570 Actinic keratosis: Secondary | ICD-10-CM | POA: Diagnosis not present

## 2017-02-25 DIAGNOSIS — D2239 Melanocytic nevi of other parts of face: Secondary | ICD-10-CM | POA: Diagnosis not present

## 2017-02-25 DIAGNOSIS — Z08 Encounter for follow-up examination after completed treatment for malignant neoplasm: Secondary | ICD-10-CM | POA: Diagnosis not present

## 2017-04-07 DIAGNOSIS — Z1231 Encounter for screening mammogram for malignant neoplasm of breast: Secondary | ICD-10-CM | POA: Diagnosis not present

## 2017-09-13 DIAGNOSIS — H524 Presbyopia: Secondary | ICD-10-CM | POA: Diagnosis not present

## 2017-09-21 DIAGNOSIS — C44319 Basal cell carcinoma of skin of other parts of face: Secondary | ICD-10-CM | POA: Diagnosis not present

## 2017-09-21 DIAGNOSIS — L0202 Furuncle of face: Secondary | ICD-10-CM | POA: Diagnosis not present

## 2017-09-21 DIAGNOSIS — H16223 Keratoconjunctivitis sicca, not specified as Sjogren's, bilateral: Secondary | ICD-10-CM | POA: Diagnosis not present

## 2017-09-21 DIAGNOSIS — Z08 Encounter for follow-up examination after completed treatment for malignant neoplasm: Secondary | ICD-10-CM | POA: Diagnosis not present

## 2017-09-21 DIAGNOSIS — Z85828 Personal history of other malignant neoplasm of skin: Secondary | ICD-10-CM | POA: Diagnosis not present

## 2017-09-21 DIAGNOSIS — B9689 Other specified bacterial agents as the cause of diseases classified elsewhere: Secondary | ICD-10-CM | POA: Diagnosis not present

## 2017-09-21 DIAGNOSIS — C44311 Basal cell carcinoma of skin of nose: Secondary | ICD-10-CM | POA: Diagnosis not present

## 2017-10-12 DIAGNOSIS — M25511 Pain in right shoulder: Secondary | ICD-10-CM | POA: Diagnosis not present

## 2017-10-12 DIAGNOSIS — M542 Cervicalgia: Secondary | ICD-10-CM | POA: Diagnosis not present

## 2017-10-12 DIAGNOSIS — M25512 Pain in left shoulder: Secondary | ICD-10-CM | POA: Diagnosis not present

## 2017-11-02 DIAGNOSIS — C44319 Basal cell carcinoma of skin of other parts of face: Secondary | ICD-10-CM | POA: Diagnosis not present

## 2017-11-16 DIAGNOSIS — D125 Benign neoplasm of sigmoid colon: Secondary | ICD-10-CM | POA: Diagnosis not present

## 2017-11-16 DIAGNOSIS — K5730 Diverticulosis of large intestine without perforation or abscess without bleeding: Secondary | ICD-10-CM | POA: Diagnosis not present

## 2017-11-16 DIAGNOSIS — Z86010 Personal history of colonic polyps: Secondary | ICD-10-CM | POA: Diagnosis not present

## 2017-11-16 DIAGNOSIS — K635 Polyp of colon: Secondary | ICD-10-CM | POA: Diagnosis not present

## 2017-11-23 DIAGNOSIS — M25512 Pain in left shoulder: Secondary | ICD-10-CM | POA: Diagnosis not present

## 2017-11-23 DIAGNOSIS — M7542 Impingement syndrome of left shoulder: Secondary | ICD-10-CM | POA: Diagnosis not present

## 2018-01-11 DIAGNOSIS — M25512 Pain in left shoulder: Secondary | ICD-10-CM | POA: Diagnosis not present

## 2018-01-17 DIAGNOSIS — M25512 Pain in left shoulder: Secondary | ICD-10-CM | POA: Diagnosis not present

## 2018-01-18 DIAGNOSIS — M7502 Adhesive capsulitis of left shoulder: Secondary | ICD-10-CM | POA: Diagnosis not present

## 2018-01-19 DIAGNOSIS — Z6829 Body mass index (BMI) 29.0-29.9, adult: Secondary | ICD-10-CM | POA: Diagnosis not present

## 2018-01-19 DIAGNOSIS — E2839 Other primary ovarian failure: Secondary | ICD-10-CM | POA: Diagnosis not present

## 2018-01-19 DIAGNOSIS — Z23 Encounter for immunization: Secondary | ICD-10-CM | POA: Diagnosis not present

## 2018-01-19 DIAGNOSIS — E785 Hyperlipidemia, unspecified: Secondary | ICD-10-CM | POA: Diagnosis not present

## 2018-01-19 DIAGNOSIS — E559 Vitamin D deficiency, unspecified: Secondary | ICD-10-CM | POA: Diagnosis not present

## 2018-01-19 DIAGNOSIS — Z0000 Encounter for general adult medical examination without abnormal findings: Secondary | ICD-10-CM | POA: Diagnosis not present

## 2018-04-14 ENCOUNTER — Ambulatory Visit: Payer: PPO | Admitting: Family Medicine

## 2018-04-17 DIAGNOSIS — Z1231 Encounter for screening mammogram for malignant neoplasm of breast: Secondary | ICD-10-CM | POA: Diagnosis not present

## 2018-05-05 DIAGNOSIS — L570 Actinic keratosis: Secondary | ICD-10-CM | POA: Diagnosis not present

## 2018-05-05 DIAGNOSIS — Z08 Encounter for follow-up examination after completed treatment for malignant neoplasm: Secondary | ICD-10-CM | POA: Diagnosis not present

## 2018-05-05 DIAGNOSIS — X32XXXD Exposure to sunlight, subsequent encounter: Secondary | ICD-10-CM | POA: Diagnosis not present

## 2018-05-05 DIAGNOSIS — Z85828 Personal history of other malignant neoplasm of skin: Secondary | ICD-10-CM | POA: Diagnosis not present

## 2018-06-02 DIAGNOSIS — E2839 Other primary ovarian failure: Secondary | ICD-10-CM | POA: Diagnosis not present

## 2018-06-12 DIAGNOSIS — N8110 Cystocele, unspecified: Secondary | ICD-10-CM | POA: Diagnosis not present

## 2018-06-12 DIAGNOSIS — M720 Palmar fascial fibromatosis [Dupuytren]: Secondary | ICD-10-CM | POA: Diagnosis not present

## 2018-06-12 DIAGNOSIS — R8290 Unspecified abnormal findings in urine: Secondary | ICD-10-CM | POA: Diagnosis not present

## 2018-06-12 DIAGNOSIS — M79662 Pain in left lower leg: Secondary | ICD-10-CM | POA: Diagnosis not present

## 2018-06-12 DIAGNOSIS — M79605 Pain in left leg: Secondary | ICD-10-CM | POA: Diagnosis not present

## 2018-06-12 DIAGNOSIS — M7989 Other specified soft tissue disorders: Secondary | ICD-10-CM | POA: Diagnosis not present

## 2018-06-12 DIAGNOSIS — N3090 Cystitis, unspecified without hematuria: Secondary | ICD-10-CM | POA: Diagnosis not present

## 2018-07-24 DIAGNOSIS — H16223 Keratoconjunctivitis sicca, not specified as Sjogren's, bilateral: Secondary | ICD-10-CM | POA: Diagnosis not present

## 2018-08-02 DIAGNOSIS — N8111 Cystocele, midline: Secondary | ICD-10-CM | POA: Diagnosis not present

## 2018-08-02 DIAGNOSIS — R351 Nocturia: Secondary | ICD-10-CM | POA: Diagnosis not present

## 2018-08-14 DIAGNOSIS — M79641 Pain in right hand: Secondary | ICD-10-CM | POA: Diagnosis not present

## 2018-08-14 DIAGNOSIS — M720 Palmar fascial fibromatosis [Dupuytren]: Secondary | ICD-10-CM | POA: Insufficient documentation

## 2018-08-14 DIAGNOSIS — M79642 Pain in left hand: Secondary | ICD-10-CM | POA: Diagnosis not present

## 2018-10-24 DIAGNOSIS — N3001 Acute cystitis with hematuria: Secondary | ICD-10-CM | POA: Diagnosis not present

## 2018-10-24 DIAGNOSIS — R300 Dysuria: Secondary | ICD-10-CM | POA: Diagnosis not present

## 2018-10-24 DIAGNOSIS — Z886 Allergy status to analgesic agent status: Secondary | ICD-10-CM | POA: Diagnosis not present

## 2018-10-24 DIAGNOSIS — R350 Frequency of micturition: Secondary | ICD-10-CM | POA: Diagnosis not present

## 2018-10-24 DIAGNOSIS — Z885 Allergy status to narcotic agent status: Secondary | ICD-10-CM | POA: Diagnosis not present

## 2018-12-13 DIAGNOSIS — R0681 Apnea, not elsewhere classified: Secondary | ICD-10-CM | POA: Diagnosis not present

## 2018-12-26 DIAGNOSIS — G4733 Obstructive sleep apnea (adult) (pediatric): Secondary | ICD-10-CM | POA: Diagnosis not present

## 2018-12-27 DIAGNOSIS — G4733 Obstructive sleep apnea (adult) (pediatric): Secondary | ICD-10-CM | POA: Diagnosis not present

## 2019-01-02 DIAGNOSIS — G4733 Obstructive sleep apnea (adult) (pediatric): Secondary | ICD-10-CM | POA: Diagnosis not present

## 2019-02-02 DIAGNOSIS — G4733 Obstructive sleep apnea (adult) (pediatric): Secondary | ICD-10-CM | POA: Diagnosis not present

## 2019-02-21 DIAGNOSIS — L570 Actinic keratosis: Secondary | ICD-10-CM | POA: Diagnosis not present

## 2019-02-21 DIAGNOSIS — L218 Other seborrheic dermatitis: Secondary | ICD-10-CM | POA: Diagnosis not present

## 2019-02-21 DIAGNOSIS — X32XXXD Exposure to sunlight, subsequent encounter: Secondary | ICD-10-CM | POA: Diagnosis not present

## 2019-02-27 DIAGNOSIS — N390 Urinary tract infection, site not specified: Secondary | ICD-10-CM | POA: Diagnosis not present

## 2019-02-27 DIAGNOSIS — M549 Dorsalgia, unspecified: Secondary | ICD-10-CM | POA: Diagnosis not present

## 2019-02-27 DIAGNOSIS — R319 Hematuria, unspecified: Secondary | ICD-10-CM | POA: Diagnosis not present

## 2019-03-04 DIAGNOSIS — G4733 Obstructive sleep apnea (adult) (pediatric): Secondary | ICD-10-CM | POA: Diagnosis not present

## 2019-03-05 DIAGNOSIS — H16223 Keratoconjunctivitis sicca, not specified as Sjogren's, bilateral: Secondary | ICD-10-CM | POA: Diagnosis not present

## 2019-04-04 DIAGNOSIS — G4733 Obstructive sleep apnea (adult) (pediatric): Secondary | ICD-10-CM | POA: Diagnosis not present

## 2019-05-05 DIAGNOSIS — G4733 Obstructive sleep apnea (adult) (pediatric): Secondary | ICD-10-CM | POA: Diagnosis not present

## 2019-05-10 DIAGNOSIS — R309 Painful micturition, unspecified: Secondary | ICD-10-CM | POA: Diagnosis not present

## 2019-05-10 DIAGNOSIS — M549 Dorsalgia, unspecified: Secondary | ICD-10-CM | POA: Diagnosis not present

## 2019-05-10 DIAGNOSIS — N3090 Cystitis, unspecified without hematuria: Secondary | ICD-10-CM | POA: Diagnosis not present

## 2019-05-24 ENCOUNTER — Other Ambulatory Visit: Payer: Self-pay | Admitting: Family Medicine

## 2019-05-24 ENCOUNTER — Ambulatory Visit
Admission: RE | Admit: 2019-05-24 | Discharge: 2019-05-24 | Disposition: A | Discharge status: Home / Self Care | Payer: PPO | Source: Ambulatory Visit | Attending: Family Medicine | Admitting: Family Medicine

## 2019-05-24 ENCOUNTER — Other Ambulatory Visit: Payer: Self-pay

## 2019-05-24 DIAGNOSIS — M549 Dorsalgia, unspecified: Secondary | ICD-10-CM

## 2019-05-24 DIAGNOSIS — M47814 Spondylosis without myelopathy or radiculopathy, thoracic region: Secondary | ICD-10-CM | POA: Diagnosis not present

## 2019-05-31 DIAGNOSIS — N3090 Cystitis, unspecified without hematuria: Secondary | ICD-10-CM | POA: Diagnosis not present

## 2019-06-04 DIAGNOSIS — G4733 Obstructive sleep apnea (adult) (pediatric): Secondary | ICD-10-CM | POA: Diagnosis not present

## 2019-06-21 DIAGNOSIS — M7061 Trochanteric bursitis, right hip: Secondary | ICD-10-CM | POA: Diagnosis not present

## 2019-06-21 DIAGNOSIS — M7062 Trochanteric bursitis, left hip: Secondary | ICD-10-CM | POA: Diagnosis not present

## 2019-06-21 DIAGNOSIS — M25552 Pain in left hip: Secondary | ICD-10-CM | POA: Diagnosis not present

## 2019-06-21 DIAGNOSIS — M545 Low back pain: Secondary | ICD-10-CM | POA: Diagnosis not present

## 2019-06-30 DIAGNOSIS — M25552 Pain in left hip: Secondary | ICD-10-CM | POA: Diagnosis not present

## 2019-07-03 DIAGNOSIS — N8111 Cystocele, midline: Secondary | ICD-10-CM | POA: Diagnosis not present

## 2019-07-03 DIAGNOSIS — M25552 Pain in left hip: Secondary | ICD-10-CM | POA: Diagnosis not present

## 2019-07-03 DIAGNOSIS — R8271 Bacteriuria: Secondary | ICD-10-CM | POA: Diagnosis not present

## 2019-07-07 DIAGNOSIS — M25552 Pain in left hip: Secondary | ICD-10-CM | POA: Diagnosis not present

## 2019-08-20 DIAGNOSIS — N952 Postmenopausal atrophic vaginitis: Secondary | ICD-10-CM | POA: Diagnosis not present

## 2019-08-20 DIAGNOSIS — R351 Nocturia: Secondary | ICD-10-CM | POA: Diagnosis not present

## 2019-08-20 DIAGNOSIS — N8110 Cystocele, unspecified: Secondary | ICD-10-CM | POA: Diagnosis not present

## 2019-08-20 DIAGNOSIS — Z87440 Personal history of urinary (tract) infections: Secondary | ICD-10-CM | POA: Diagnosis not present

## 2019-09-18 DIAGNOSIS — C44319 Basal cell carcinoma of skin of other parts of face: Secondary | ICD-10-CM | POA: Diagnosis not present

## 2019-10-24 DIAGNOSIS — C44319 Basal cell carcinoma of skin of other parts of face: Secondary | ICD-10-CM | POA: Diagnosis not present

## 2019-10-24 DIAGNOSIS — B078 Other viral warts: Secondary | ICD-10-CM | POA: Diagnosis not present

## 2019-12-28 DIAGNOSIS — Z85828 Personal history of other malignant neoplasm of skin: Secondary | ICD-10-CM | POA: Diagnosis not present

## 2019-12-28 DIAGNOSIS — Z08 Encounter for follow-up examination after completed treatment for malignant neoplasm: Secondary | ICD-10-CM | POA: Diagnosis not present

## 2019-12-28 DIAGNOSIS — L821 Other seborrheic keratosis: Secondary | ICD-10-CM | POA: Diagnosis not present

## 2020-06-25 DIAGNOSIS — X32XXXD Exposure to sunlight, subsequent encounter: Secondary | ICD-10-CM | POA: Diagnosis not present

## 2020-06-25 DIAGNOSIS — Z08 Encounter for follow-up examination after completed treatment for malignant neoplasm: Secondary | ICD-10-CM | POA: Diagnosis not present

## 2020-06-25 DIAGNOSIS — L218 Other seborrheic dermatitis: Secondary | ICD-10-CM | POA: Diagnosis not present

## 2020-06-25 DIAGNOSIS — L570 Actinic keratosis: Secondary | ICD-10-CM | POA: Diagnosis not present

## 2020-06-25 DIAGNOSIS — Z85828 Personal history of other malignant neoplasm of skin: Secondary | ICD-10-CM | POA: Diagnosis not present

## 2020-08-18 DIAGNOSIS — H0015 Chalazion left lower eyelid: Secondary | ICD-10-CM | POA: Diagnosis not present

## 2020-08-19 DIAGNOSIS — Z1231 Encounter for screening mammogram for malignant neoplasm of breast: Secondary | ICD-10-CM | POA: Diagnosis not present

## 2020-10-07 DIAGNOSIS — R300 Dysuria: Secondary | ICD-10-CM | POA: Diagnosis not present

## 2020-10-07 DIAGNOSIS — N3001 Acute cystitis with hematuria: Secondary | ICD-10-CM | POA: Diagnosis not present

## 2020-10-07 DIAGNOSIS — N3000 Acute cystitis without hematuria: Secondary | ICD-10-CM | POA: Diagnosis not present

## 2020-10-07 DIAGNOSIS — N390 Urinary tract infection, site not specified: Secondary | ICD-10-CM | POA: Diagnosis not present

## 2020-10-10 DIAGNOSIS — N3000 Acute cystitis without hematuria: Secondary | ICD-10-CM | POA: Diagnosis not present

## 2020-12-31 DIAGNOSIS — H524 Presbyopia: Secondary | ICD-10-CM | POA: Diagnosis not present

## 2021-01-01 DIAGNOSIS — Z0100 Encounter for examination of eyes and vision without abnormal findings: Secondary | ICD-10-CM | POA: Diagnosis not present

## 2021-01-30 DIAGNOSIS — L820 Inflamed seborrheic keratosis: Secondary | ICD-10-CM | POA: Diagnosis not present

## 2021-01-30 DIAGNOSIS — C44311 Basal cell carcinoma of skin of nose: Secondary | ICD-10-CM | POA: Diagnosis not present

## 2021-02-16 ENCOUNTER — Other Ambulatory Visit: Payer: Self-pay

## 2021-02-16 ENCOUNTER — Ambulatory Visit
Admission: EM | Admit: 2021-02-16 | Discharge: 2021-02-16 | Disposition: A | Discharge status: Home / Self Care | Payer: Medicare Other

## 2021-02-16 ENCOUNTER — Encounter: Payer: Self-pay | Admitting: Emergency Medicine

## 2021-02-16 DIAGNOSIS — S70361A Insect bite (nonvenomous), right thigh, initial encounter: Secondary | ICD-10-CM | POA: Diagnosis not present

## 2021-02-16 DIAGNOSIS — W57XXXA Bitten or stung by nonvenomous insect and other nonvenomous arthropods, initial encounter: Secondary | ICD-10-CM

## 2021-02-16 DIAGNOSIS — R21 Rash and other nonspecific skin eruption: Secondary | ICD-10-CM

## 2021-02-16 MED ORDER — DOXYCYCLINE HYCLATE 100 MG PO TABS
200.0000 mg | ORAL_TABLET | Freq: Once | ORAL | Status: AC
  Administered 2021-02-16: 200 mg via ORAL

## 2021-02-16 NOTE — Discharge Instructions (Addendum)
You were treated today with a one-time dose of doxycycline for prophylactic prevention of tickborne illnesses.    See the attached information on tick bites.  Follow-up with your primary care provider or return here right away if you develop fever, rash, or other concerning symptoms.

## 2021-02-16 NOTE — ED Triage Notes (Signed)
Please presents today with c/o being bit by two ticks to right upper leg this morning. She believes that she was able to remove both ticks.

## 2021-02-16 NOTE — ED Provider Notes (Signed)
[name]    CSN: [id number] Arrival date & time: 02/16/21  1010      History   Chief Complaint Chief Complaint  Patient presents with   Insect Bite    HPI [name] is a 70 y.o. female.  Patient presents with concern for tickborne illnesses.  She removed two "deer ticks" from her right upper thigh this morning.  There is localized redness at both sites.  She believes they were attached for <24 hours.  She was able to remove both ticks intact.  She denies fever, drainage, unusual arthralgias, headache, malaise, or other symptoms.  Her medical history includes seasonal allergies, IBS, diverticulosis, osteoarthritis, pancreatitis, depression, anxiety.    The history is provided by the patient and medical records.   Past Medical History:  Diagnosis Date   Allergic rhinitis    Anxiety    Depression    Diverticulosis    HEMMORHOIDS ON COLONOSCOPY   IBS (irritable bowel syndrome)    DIARRHEA PRONE   OA (osteoarthritis)    HANDS   Skin cancer, basal cell    HX skin cancer per patient   TMJ syndrome    Vitamin D deficiency     Patient Active Problem List   Diagnosis Date Noted   Acute pancreatitis 05/31/2016   Pancreatitis 05/31/2016   Nausea and vomiting 05/31/2016   Leukocytosis 05/31/2016   Hyperglycemia 05/31/2016    Past Surgical History:  Procedure Laterality Date   BONE SPURS REMOVAL     TOES   BREAST BIOPSY     RIGHT   BREAST ENHANCEMENT SURGERY     THEN REMOVED   CHOLECYSTECTOMY N/A 06/01/2016   Procedure: LAPAROSCOPIC CHOLECYSTECTOMY WITH INTRAOPERATIVE CHOLANGIOGRAM;  Surgeon: Coralie Keens, MD;  Location: [facility];  Service: General;  Laterality: N/A;   FOOT SURGERY     Right   SHOULDER SURGERY     RIGHT   TUBAL LIGATION     VESICOVAGINAL FISTULA CLOSURE W/ TAH     WITHOUT OOPHS    OB History   No obstetric history on file.      Home Medications    Prior to Admission medications   Medication Sig Start Date End Date  Taking? Authorizing Provider  celecoxib (CELEBREX) 200 MG capsule celecoxib 200 mg capsule  TAKE 1 CAPSULE BY MOUTH EVERY DAY   Yes [provider]  Cholecalciferol (VITAMIN D) 2000 UNITS CAPS Take 5,000 Units by mouth daily.     [provider]  ondansetron (ZOFRAN ODT) 4 MG disintegrating tablet 4mg  ODT q4 hours prn nausea/vomit 10/31/14   Milton Ferguson, MD  Polyvinyl Alcohol-Povidone (REFRESH OP) Apply 1-2 drops to eye daily as needed (dry eyes).    [provider]    Family History Family History  Problem Relation Age of Onset   Hypertension Mother    Cancer - Other Mother    Lung cancer Father    Cancer - Other Brother    CVA Brother    Heart attack Brother    AAA (abdominal aortic aneurysm) Sister    Depression Sister     Social History Social History   Tobacco Use   Smoking status: Never   Smokeless tobacco: Never  Substance Use Topics   Alcohol use: No   Drug use: No     Allergies   Ketorolac tromethamine, Morphine and related, Percocet [oxycodone-acetaminophen], and Zoloft [sertraline hcl]   Review of Systems Review of Systems   Physical Exam Triage Vital Signs ED  Triage Vitals  Enc Vitals Group     BP      Pulse      Resp      Temp      Temp src      SpO2      Weight      Height      Head Circumference      Peak Flow      Pain Score      Pain Loc      Pain Edu?      Excl. in [place]?    No data found.  Updated Vital Signs BP (!) 143/81 (BP Location: Left Arm)   Pulse 83   Temp 98.7 F (37.1 C) (Oral)   Resp 18   SpO2 95%   Visual Acuity Right Eye Distance:   Left Eye Distance:   Bilateral Distance:    Right Eye Near:   Left Eye Near:    Bilateral Near:     Physical Exam Vitals and nursing note reviewed.  Constitutional:      General: She is not in acute distress.    Appearance: She is well-developed. She is not ill-appearing.  HENT:     Head: Normocephalic and atraumatic.     Mouth/Throat:     Mouth:  Mucous membranes are moist.  Eyes:     Conjunctiva/sclera: Conjunctivae normal.  Cardiovascular:     Rate and Rhythm: Normal rate and regular rhythm.     Heart sounds: Normal heart sounds.  Pulmonary:     Effort: Pulmonary effort is normal. No respiratory distress.     Breath sounds: Normal breath sounds.  Abdominal:     Palpations: Abdomen is soft.     Tenderness: There is no abdominal tenderness.  Musculoskeletal:     Cervical back: Neck supple.  Skin:    General: Skin is warm and dry.     Findings: Lesion present.     Comments: Two 1 cm areas of erythema with central open lesion on right thigh. No drainage.   Neurological:     General: No focal deficit present.     Mental Status: She is alert and oriented to person, place, and time.     Gait: Gait normal.  Psychiatric:        Mood and Affect: Mood normal.        Behavior: Behavior normal.     UC Treatments / Results  Labs (all labs ordered are listed, but only abnormal results are displayed) Labs Reviewed - No data to display  EKG   Radiology No results found.  Procedures Procedures (including critical care time)  Medications Ordered in UC Medications  doxycycline (VIBRA-TABS) tablet 200 mg (200 mg Oral Given 02/16/21 1036)    Initial Impression / Assessment and Plan / UC Course  I have reviewed the triage vital signs and the nursing notes.  Pertinent labs & imaging results that were available during my care of the patient were reviewed by me and considered in my medical decision making (see chart for details).  Tick bites on right thigh, rash.  The tick bites occurred in the last 24 hours.  Patient removed ticks intact this morning.  Treating with one-time dose of doxycycline 200 mg.  Education provided on tick bites.  Instructed patient to follow-up with her PCP or return here right away if she develops fever, rash, or other concerning symptoms.  She agrees to plan of care.   Final Clinical Impressions(s) /  UC  Diagnoses   Final diagnoses:  Tick bite of right thigh, initial encounter  Rash     Discharge Instructions      You were treated today with a one-time dose of doxycycline for prophylactic prevention of tickborne illnesses.    See the attached information on tick bites.  Follow-up with your primary care provider or return here right away if you develop fever, rash, or other concerning symptoms.         ED Prescriptions   None    PDMP not reviewed this encounter.   Sharion Balloon, NP 02/16/21 1047

## 2021-03-10 DIAGNOSIS — M79671 Pain in right foot: Secondary | ICD-10-CM | POA: Diagnosis not present

## 2021-03-10 DIAGNOSIS — M25552 Pain in left hip: Secondary | ICD-10-CM | POA: Diagnosis not present

## 2021-08-24 IMAGING — CR DG THORACIC SPINE 3V
3 series · 3 of 3 positions shown · non-contrast
Comparison: None.

CLINICAL DATA: Mid back pain and stiffness for 3 months.

EXAM:
THORACIC SPINE - 3 VIEWS

[t thoracic spine ap]
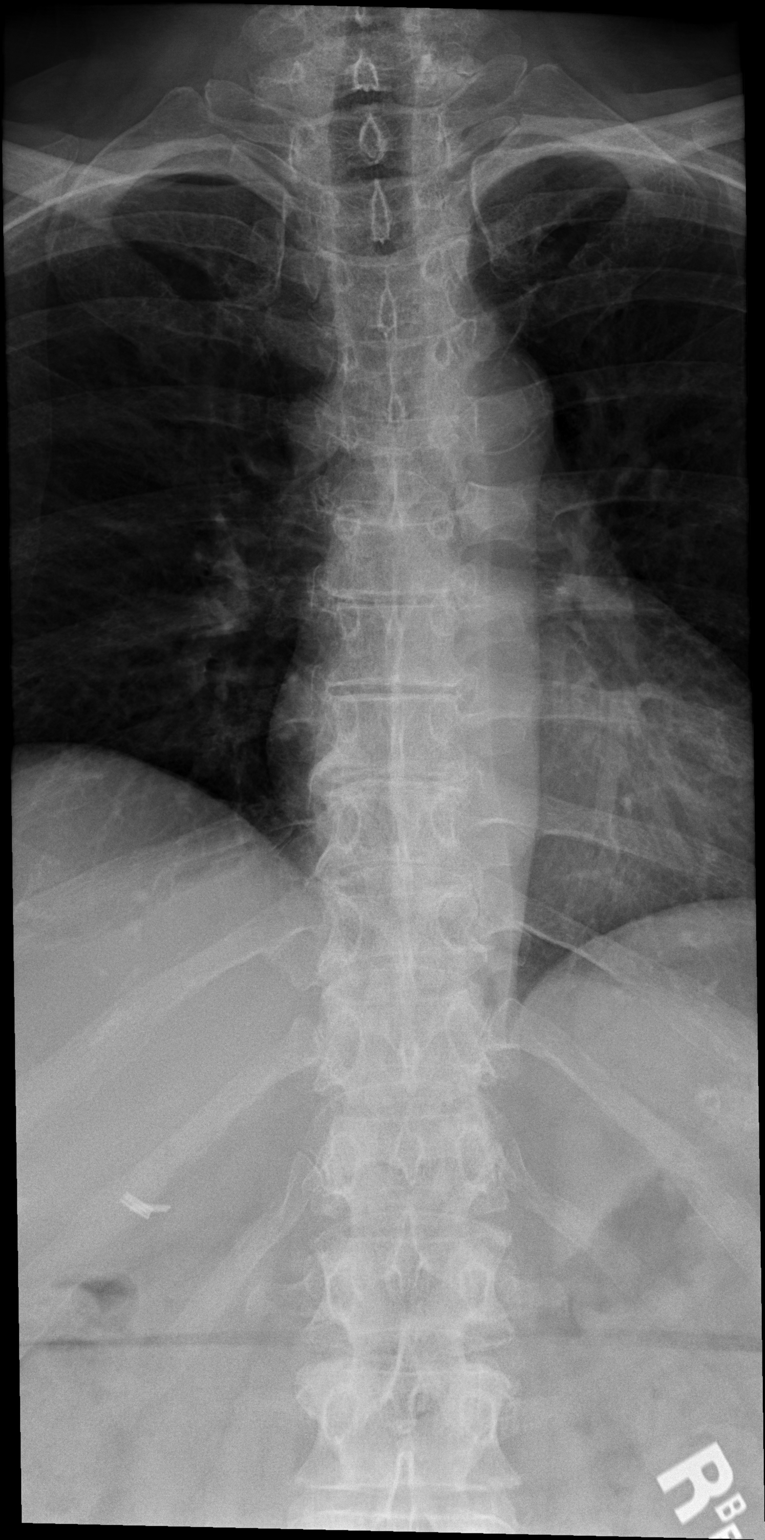

[t thoracic spine lat]
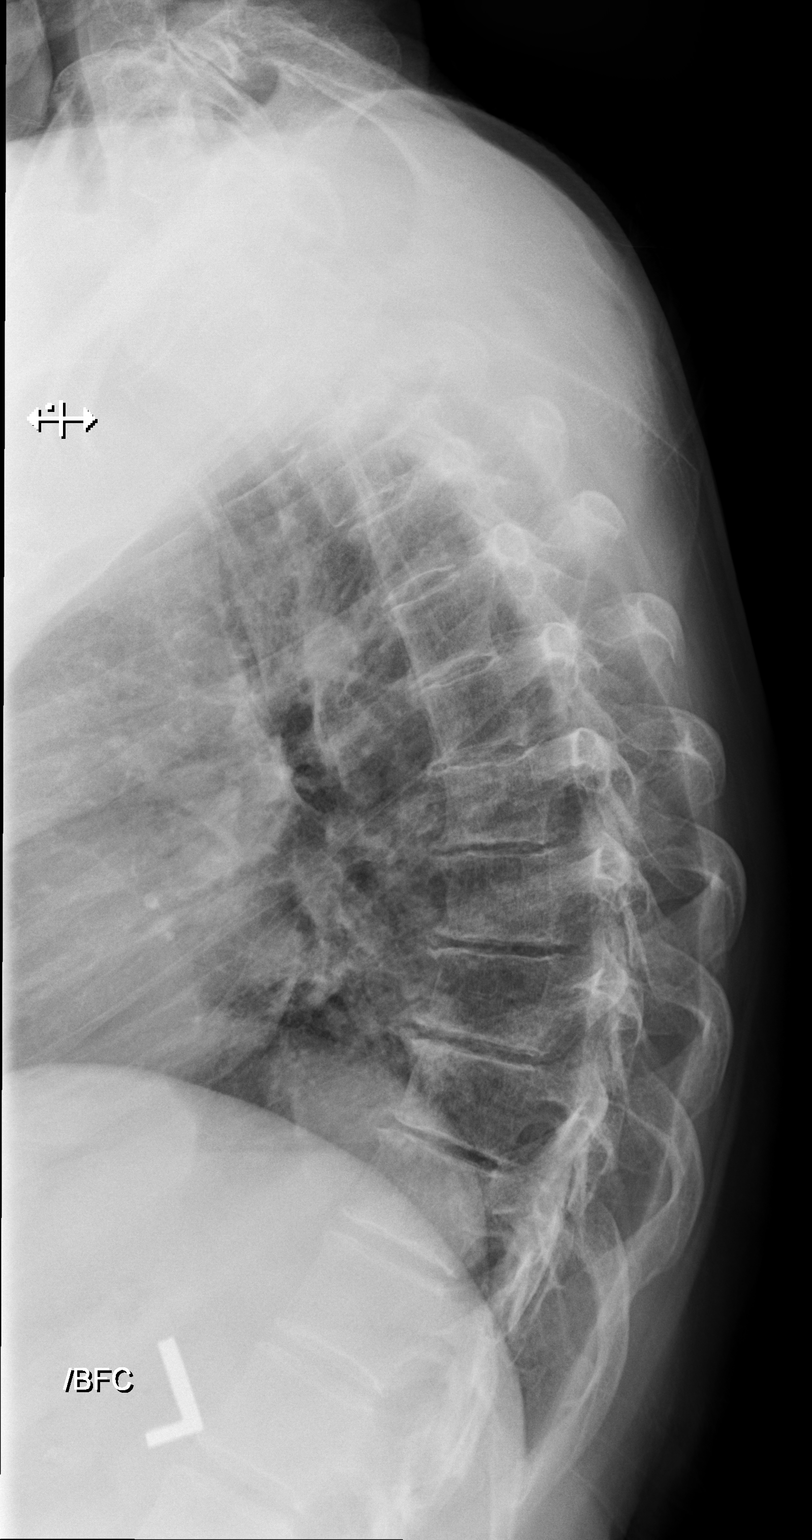

[t thoracic swimmers]
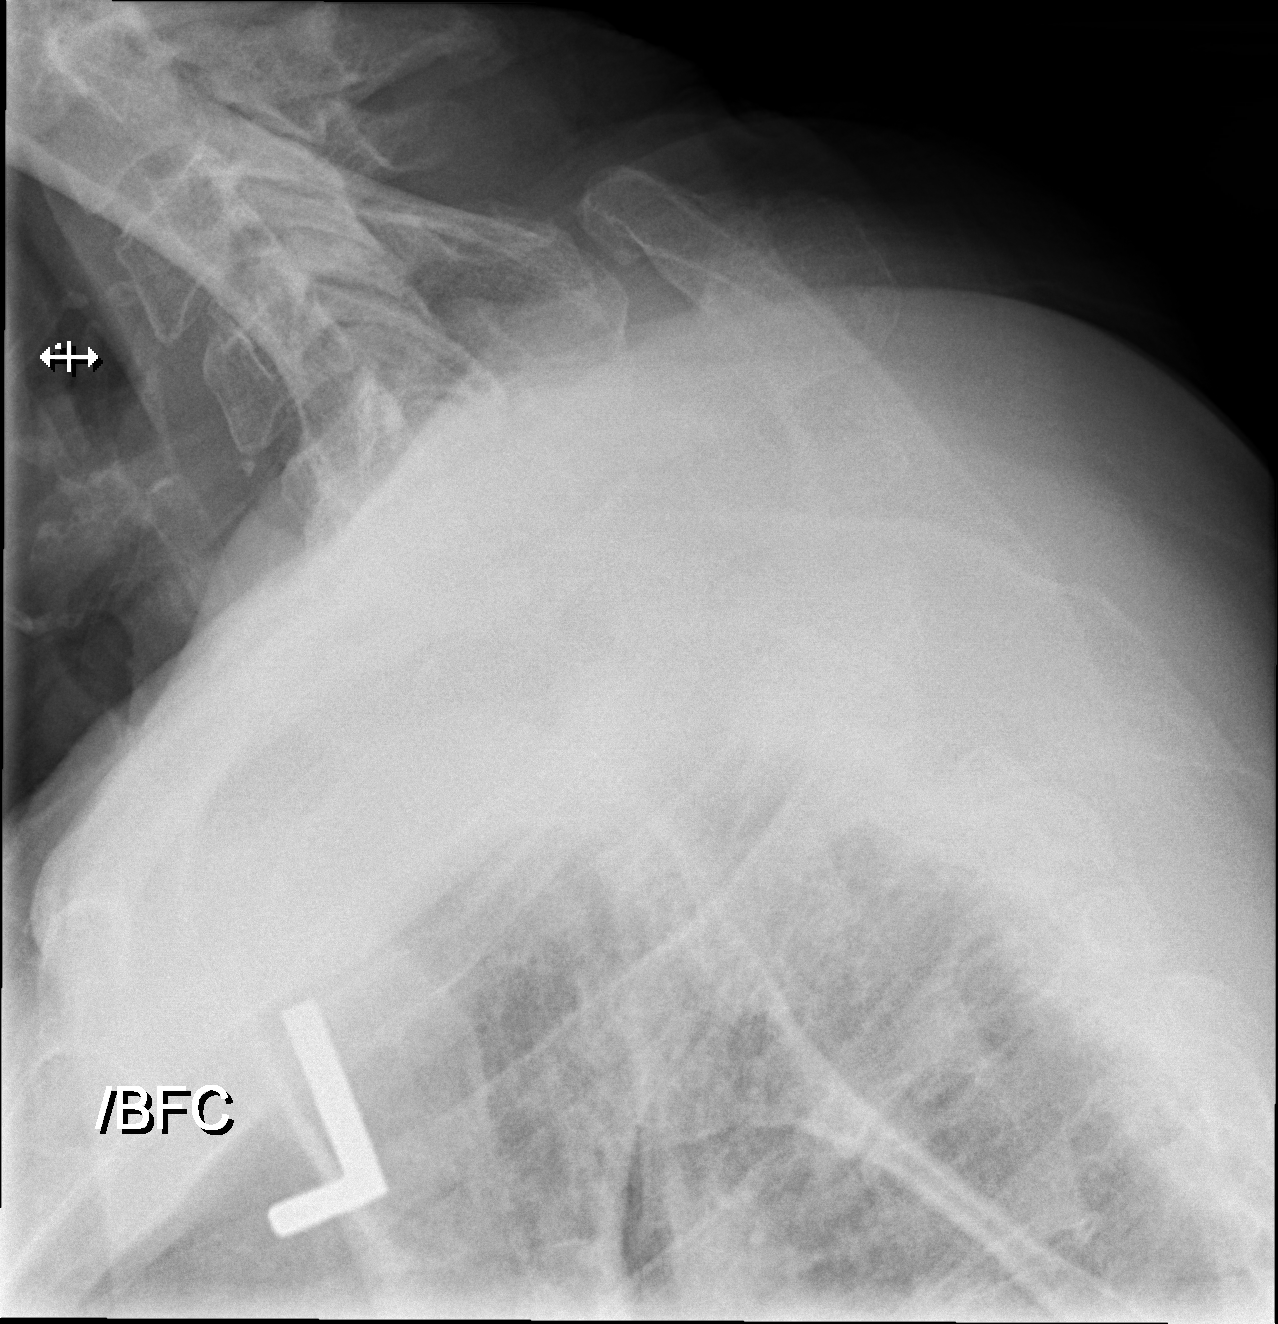

[3 of 3 positions shown; findings below may reference images not displayed]

FINDINGS: Normal alignment of the thoracic vertebral bodies. Mild to moderate
degenerative changes but no acute bony findings or destructive bony
changes. No abnormal paraspinal soft tissue swelling. The visualized
posterior ribs are intact and the visualized lungs are clear.
IMPRESSION: Normal alignment and no acute bony findings.

Mild to moderate degenerative changes.

## 2021-12-07 ENCOUNTER — Other Ambulatory Visit: Payer: Self-pay

## 2021-12-07 ENCOUNTER — Ambulatory Visit
Admission: EM | Admit: 2021-12-07 | Discharge: 2021-12-07 | Disposition: A | Discharge status: Home / Self Care | Payer: PPO | Attending: Urgent Care | Admitting: Urgent Care

## 2021-12-07 ENCOUNTER — Encounter: Payer: Self-pay | Admitting: *Deleted

## 2021-12-07 DIAGNOSIS — N3000 Acute cystitis without hematuria: Secondary | ICD-10-CM | POA: Insufficient documentation

## 2021-12-07 LAB — POCT URINALYSIS DIP (MANUAL ENTRY)
Bilirubin, UA: NEGATIVE
Glucose, UA: 100 mg/dL — AB
Ketones, POC UA: NEGATIVE mg/dL
Nitrite, UA: POSITIVE — AB
Spec Grav, UA: 1.01 (ref 1.010–1.025)
Urobilinogen, UA: 0.2 E.U./dL
pH, UA: 5.5 (ref 5.0–8.0)

## 2021-12-07 MED ORDER — PHENAZOPYRIDINE HCL 200 MG PO TABS: 200.0000 mg | ORAL_TABLET | Freq: Three times a day (TID) | ORAL | 0 refills | Status: DC | PRN

## 2021-12-07 MED ORDER — NITROFURANTOIN MONOHYD MACRO 100 MG PO CAPS: 100.0000 mg | ORAL_CAPSULE | Freq: Two times a day (BID) | ORAL | 0 refills | Status: DC

## 2021-12-07 NOTE — ED Triage Notes (Signed)
Pt reports Sx's started yesterday with urgency,dysuria, and frequency. Pt also reports back pain last week. ?

## 2021-12-07 NOTE — Discharge Instructions (Signed)
Your symptoms are consistent with a urinary tract infection. ?Start taking Macrobid twice daily until gone, 5 days. ?Take Pyridium as needed, up to 3 times daily for pain and frequency.  Do not exceed 2 days of use. ?Drink plenty of water. ?Return to clinic if any worsening symptoms, development of blood in urine, fever or flank pain. Follow up with PCP if symptoms persist. ?

## 2021-12-07 NOTE — ED Provider Notes (Signed)
?[facility] ? ? ? ?CSN: [id number] ?Arrival date & time: 12/07/21  0809 ? ? ?  ? ?History   ?Chief Complaint ?Chief Complaint  ?Patient presents with  ? odor to urine  ? Back Pain  ? Urinary Urgency  ? Dysuria  ? ? ?HPI ?[name] is a 71 y.o. female.  ? ?Pleasant 71 year old female with a known history of UTIs presents today with a 2-day history of dysuria, frequency, urinary odor.  The last UTI she believes was over a year ago.  She states Macrobid has helped the most in the past.  She denies flank pain, hematuria, pelvic pain, vaginal discharge.  She states her lower back is uncomfortable, but admits to being in the car and driving a lot recently. She denies nausea, vomiting, rash, fever.  She has been taking over-the-counter AZO.  She admits to a history of uterine prolapse. ? ? ?Back Pain ?Associated symptoms: dysuria (odor)   ?Associated symptoms: no abdominal pain, no fever and no pelvic pain   ?Dysuria ?Associated symptoms: no abdominal pain, no fever, no flank pain, no nausea, no vaginal discharge and no vomiting   ? ?Past Medical History:  ?Diagnosis Date  ? Allergic rhinitis   ? Anxiety   ? Depression   ? Diverticulosis   ? HEMMORHOIDS ON COLONOSCOPY  ? IBS (irritable bowel syndrome)   ? DIARRHEA PRONE  ? OA (osteoarthritis)   ? HANDS  ? Skin cancer, basal cell   ? HX skin cancer per patient  ? TMJ syndrome   ? Vitamin D deficiency   ? ? ?Patient Active Problem List  ? Diagnosis Date Noted  ? Acute pancreatitis 05/31/2016  ? Pancreatitis 05/31/2016  ? Nausea and vomiting 05/31/2016  ? Leukocytosis 05/31/2016  ? Hyperglycemia 05/31/2016  ? ? ?Past Surgical History:  ?Procedure Laterality Date  ? BONE SPURS REMOVAL    ? TOES  ? BREAST BIOPSY    ? RIGHT  ? BREAST ENHANCEMENT SURGERY    ? THEN REMOVED  ? CHOLECYSTECTOMY N/A 06/01/2016  ? Procedure: LAPAROSCOPIC CHOLECYSTECTOMY WITH INTRAOPERATIVE CHOLANGIOGRAM;  Surgeon: Coralie Keens, MD;  Location: [place];  Service: General;  Laterality:  N/A;  ? FOOT SURGERY    ? Right  ? SHOULDER SURGERY    ? RIGHT  ? TUBAL LIGATION    ? VESICOVAGINAL FISTULA CLOSURE W/ TAH    ? WITHOUT OOPHS  ? ? ?OB History   ?No obstetric history on file. ?  ? ? ? ?Home Medications   ? ?Prior to Admission medications   ?Medication Sig Start Date End Date Taking? Authorizing Provider  ?nitrofurantoin, macrocrystal-monohydrate, (MACROBID) 100 MG capsule Take 1 capsule (100 mg total) by mouth 2 (two) times daily. 12/07/21  Yes Octaviano Mukai L, PA  ?phenazopyridine (PYRIDIUM) 200 MG tablet Take 1 tablet (200 mg total) by mouth 3 (three) times daily as needed for pain. 12/07/21  Yes Adrianna Dudas L, PA  ?celecoxib (CELEBREX) 200 MG capsule celecoxib 200 mg capsule ? TAKE 1 CAPSULE BY MOUTH EVERY DAY    [provider]  ?Cholecalciferol (VITAMIN D) 2000 UNITS CAPS Take 5,000 Units by mouth daily.     [provider]  ?ondansetron (ZOFRAN ODT) 4 MG disintegrating tablet '4mg'$  ODT q4 hours prn nausea/vomit 10/31/14   Milton Ferguson, MD  ?Polyvinyl Alcohol-Povidone (REFRESH OP) Apply 1-2 drops to eye daily as needed (dry eyes).    [provider]  ? ? ?Family History ?Family History  ?Problem Relation  Age of Onset  ? Hypertension Mother   ? Cancer - Other Mother   ? Lung cancer Father   ? Cancer - Other Brother   ? CVA Brother   ? Heart attack Brother   ? AAA (abdominal aortic aneurysm) Sister   ? Depression Sister   ? ? ?Social History ?Social History  ? ?Tobacco Use  ? Smoking status: Never  ? Smokeless tobacco: Never  ?Substance Use Topics  ? Alcohol use: No  ? Drug use: No  ? ? ? ?Allergies   ?Ketorolac tromethamine, Morphine and related, Percocet [oxycodone-acetaminophen], and Zoloft [sertraline hcl] ? ? ?Review of Systems ?Review of Systems  ?Constitutional:  Negative for fever.  ?Gastrointestinal:  Negative for abdominal pain, nausea and vomiting.  ?Genitourinary:  Positive for dysuria (odor) and frequency. Negative for flank pain, pelvic pain, vaginal  bleeding, vaginal discharge and vaginal pain.  ?Musculoskeletal:  Negative for back pain.  ? ? ?Physical Exam ?Triage Vital Signs ?ED Triage Vitals  ?Enc Vitals Group  ?   BP 12/07/21 0828 124/78  ?   Pulse Rate 12/07/21 0828 77  ?   Resp 12/07/21 0828 18  ?   Temp 12/07/21 0828 98 ?F (36.7 ?C)  ?   Temp src --   ?   SpO2 12/07/21 0828 96 %  ?   Weight --   ?   Height --   ?   Head Circumference --   ?   Peak Flow --   ?   Pain Score 12/07/21 0824 2  ?   Pain Loc --   ?   Pain Edu? --   ?   Excl. in [place]? --   ? ?No data found. ? ?Updated Vital Signs ?BP 124/78   Pulse 77   Temp 98 ?F (36.7 ?C)   Resp 18   SpO2 96%  ? ?Visual Acuity ?Right Eye Distance:   ?Left Eye Distance:   ?Bilateral Distance:   ? ?Right Eye Near:   ?Left Eye Near:    ?Bilateral Near:    ? ?Physical Exam ?Vitals and nursing note reviewed.  ?Constitutional:   ?   General: She is not in acute distress. ?   Appearance: Normal appearance. She is normal weight. She is not ill-appearing, toxic-appearing or diaphoretic.  ?HENT:  ?   Head: Normocephalic and atraumatic.  ?Eyes:  ?   Pupils: Pupils are equal, round, and reactive to light.  ?Cardiovascular:  ?   Rate and Rhythm: Normal rate.  ?   Pulses: Normal pulses.  ?   Heart sounds: Normal heart sounds. No murmur heard. ?  No friction rub. No gallop.  ?Pulmonary:  ?   Effort: Pulmonary effort is normal. No respiratory distress.  ?   Breath sounds: Normal breath sounds. No wheezing or rales.  ?Chest:  ?   Chest wall: No tenderness.  ?Abdominal:  ?   General: Abdomen is flat. Bowel sounds are normal. There is no distension.  ?   Palpations: Abdomen is soft. There is no mass.  ?   Tenderness: There is no abdominal tenderness. There is no right CVA tenderness, left CVA tenderness, guarding or rebound.  ?   Hernia: No hernia is present.  ?Musculoskeletal:  ?   Right lower leg: No edema.  ?   Left lower leg: No edema.  ?Skin: ?   General: Skin is warm.  ?   Findings: No bruising, erythema or rash.   ?Neurological:  ?  General: No focal deficit present.  ?   Mental Status: She is alert. Mental status is at baseline.  ?Psychiatric:     ?   Mood and Affect: Mood normal.  ? ? ? ?UC Treatments / Results  ?Labs ?(all labs ordered are listed, but only abnormal results are displayed) ?Labs Reviewed  ?POCT URINALYSIS DIP (MANUAL ENTRY) - Abnormal; Notable for the following components:  ?    Result Value  ? Color, UA orange (*)   ? Clarity, UA cloudy (*)   ? Glucose, UA =100 (*)   ? Blood, UA moderate (*)   ? Protein Ur, POC trace (*)   ? Nitrite, UA Positive (*)   ? Leukocytes, UA Large (3+) (*)   ? All other components within normal limits  ?URINE CULTURE  ? ? ?EKG ? ? ?Radiology ?No results found. ? ?Procedures ?Procedures (including critical care time) ? ?Medications Ordered in UC ?Medications - No data to display ? ?Initial Impression / Assessment and Plan / UC Course  ?I have reviewed the triage vital signs and the nursing notes. ? ?Pertinent labs & imaging results that were available during my care of the patient were reviewed by me and considered in my medical decision making (see chart for details). ? ?  ? ?UTI - pt with known hx of such. Has had success with macrobid in the past. Will send out urine cx for confirmation and sensitivity. Start macrobid BID x 5 days. Pyridium PRN. ER precautions reviewed. ?Back pain - lower back. No flank pain. Pt take celebrex for OA, may continue. F/U with PCP if sx persist. ? ?Final Clinical Impressions(s) / UC Diagnoses  ? ?Final diagnoses:  ?Acute cystitis without hematuria  ? ? ? ?Discharge Instructions   ? ?  ?Your symptoms are consistent with a urinary tract infection. ?Start taking Macrobid twice daily until gone, 5 days. ?Take Pyridium as needed, up to 3 times daily for pain and frequency.  Do not exceed 2 days of use. ?Drink plenty of water. ?Return to clinic if any worsening symptoms, development of blood in urine, fever or flank pain. Follow up with PCP if symptoms  persist. ? ? ? ?ED Prescriptions   ? ? Medication Sig Dispense Auth. Provider  ? nitrofurantoin, macrocrystal-monohydrate, (MACROBID) 100 MG capsule Take 1 capsule (100 mg total) by mouth 2 (two) times daily. 10 capsul

## 2021-12-09 ENCOUNTER — Telehealth (HOSPITAL_COMMUNITY): Payer: Self-pay | Admitting: Emergency Medicine

## 2021-12-09 LAB — URINE CULTURE: Culture: 30000 — AB

## 2021-12-09 MED ORDER — CEPHALEXIN 500 MG PO CAPS: 500.0000 mg | ORAL_CAPSULE | Freq: Two times a day (BID) | ORAL | 0 refills | Status: AC

## 2021-12-31 DIAGNOSIS — Z791 Long term (current) use of non-steroidal anti-inflammatories (NSAID): Secondary | ICD-10-CM | POA: Diagnosis not present

## 2021-12-31 DIAGNOSIS — E559 Vitamin D deficiency, unspecified: Secondary | ICD-10-CM | POA: Diagnosis not present

## 2021-12-31 DIAGNOSIS — N952 Postmenopausal atrophic vaginitis: Secondary | ICD-10-CM | POA: Diagnosis not present

## 2021-12-31 DIAGNOSIS — G4730 Sleep apnea, unspecified: Secondary | ICD-10-CM | POA: Diagnosis not present

## 2021-12-31 DIAGNOSIS — R1013 Epigastric pain: Secondary | ICD-10-CM | POA: Diagnosis not present

## 2021-12-31 DIAGNOSIS — F5101 Primary insomnia: Secondary | ICD-10-CM | POA: Diagnosis not present

## 2022-01-20 DIAGNOSIS — G4700 Insomnia, unspecified: Secondary | ICD-10-CM | POA: Diagnosis not present

## 2022-01-20 DIAGNOSIS — G4733 Obstructive sleep apnea (adult) (pediatric): Secondary | ICD-10-CM | POA: Diagnosis not present

## 2022-01-22 ENCOUNTER — Ambulatory Visit
Admission: EM | Admit: 2022-01-22 | Discharge: 2022-01-22 | Disposition: A | Discharge status: Home / Self Care | Payer: PPO | Attending: Family Medicine | Admitting: Family Medicine

## 2022-01-22 ENCOUNTER — Encounter: Payer: Self-pay | Admitting: Emergency Medicine

## 2022-01-22 DIAGNOSIS — L089 Local infection of the skin and subcutaneous tissue, unspecified: Secondary | ICD-10-CM

## 2022-01-22 DIAGNOSIS — S30861A Insect bite (nonvenomous) of abdominal wall, initial encounter: Secondary | ICD-10-CM | POA: Diagnosis not present

## 2022-01-22 DIAGNOSIS — W57XXXA Bitten or stung by nonvenomous insect and other nonvenomous arthropods, initial encounter: Secondary | ICD-10-CM | POA: Diagnosis not present

## 2022-01-22 MED ORDER — DOXYCYCLINE HYCLATE 100 MG PO CAPS: 100.0000 mg | ORAL_CAPSULE | Freq: Two times a day (BID) | ORAL | 0 refills | Status: AC

## 2022-01-22 NOTE — ED Provider Notes (Signed)
[facility]    CSN: [id number] Arrival date & time: 01/22/22  1055      History   Chief Complaint Chief Complaint  Patient presents with   Tick Removal    HPI [name] is a 71 y.o. female.   HPI Patient presents today for evaluation of tick bite wound she developed yesterday. She reports walking in a wooded area and noticed the tick last night prior to showering. She removed the tick intact and reports the tick type was a deer tick. Denies any other symptoms. Past Medical History:  Diagnosis Date   Allergic rhinitis    Anxiety    Depression    Diverticulosis    HEMMORHOIDS ON COLONOSCOPY   IBS (irritable bowel syndrome)    DIARRHEA PRONE   OA (osteoarthritis)    HANDS   Skin cancer, basal cell    HX skin cancer per patient   TMJ syndrome    Vitamin D deficiency     Patient Active Problem List   Diagnosis Date Noted   Acute pancreatitis 05/31/2016   Pancreatitis 05/31/2016   Nausea and vomiting 05/31/2016   Leukocytosis 05/31/2016   Hyperglycemia 05/31/2016    Past Surgical History:  Procedure Laterality Date   BONE SPURS REMOVAL     TOES   BREAST BIOPSY     RIGHT   BREAST ENHANCEMENT SURGERY     THEN REMOVED   CHOLECYSTECTOMY N/A 06/01/2016   Procedure: LAPAROSCOPIC CHOLECYSTECTOMY WITH INTRAOPERATIVE CHOLANGIOGRAM;  Surgeon: Coralie Keens, MD;  Location: [place];  Service: General;  Laterality: N/A;   FOOT SURGERY     Right   SHOULDER SURGERY     RIGHT   TUBAL LIGATION     VESICOVAGINAL FISTULA CLOSURE W/ TAH     WITHOUT OOPHS    OB History   No obstetric history on file.      Home Medications    Prior to Admission medications   Medication Sig Start Date End Date Taking? Authorizing Provider  doxycycline (VIBRAMYCIN) 100 MG capsule Take 1 capsule (100 mg total) by mouth 2 (two) times daily for 7 days. 01/22/22 01/29/22 Yes Scot Jun, FNP  celecoxib (CELEBREX) 200 MG capsule celecoxib 200 mg capsule  TAKE 1 CAPSULE  BY MOUTH EVERY DAY    [provider]  Cholecalciferol (VITAMIN D) 2000 UNITS CAPS Take 5,000 Units by mouth daily.     [provider]  nitrofurantoin, macrocrystal-monohydrate, (MACROBID) 100 MG capsule Take 1 capsule (100 mg total) by mouth 2 (two) times daily. 12/07/21   Crain, Whitney L, PA  ondansetron (ZOFRAN ODT) 4 MG disintegrating tablet '4mg'$  ODT q4 hours prn nausea/vomit 10/31/14   Milton Ferguson, MD  phenazopyridine (PYRIDIUM) 200 MG tablet Take 1 tablet (200 mg total) by mouth 3 (three) times daily as needed for pain. 12/07/21   Crain, Whitney L, PA  Polyvinyl Alcohol-Povidone (REFRESH OP) Apply 1-2 drops to eye daily as needed (dry eyes).    [provider]    Family History Family History  Problem Relation Age of Onset   Hypertension Mother    Cancer - Other Mother    Lung cancer Father    Cancer - Other Brother    CVA Brother    Heart attack Brother    AAA (abdominal aortic aneurysm) Sister    Depression Sister     Social History Social History   Tobacco Use   Smoking status: Never   Smokeless tobacco: Never  Vaping Use  Vaping Use: Never used  Substance Use Topics   Alcohol use: No   Drug use: No     Allergies   Ketorolac tromethamine, Morphine and related, Percocet [oxycodone-acetaminophen], and Zoloft [sertraline hcl]   Review of Systems Review of Systems Pertinent negatives listed in HPI   Physical Exam Triage Vital Signs ED Triage Vitals  Enc Vitals Group     BP      Pulse      Resp      Temp      Temp src      SpO2      Weight      Height      Head Circumference      Peak Flow      Pain Score      Pain Loc      Pain Edu?      Excl. in [place]?    No data found.  Updated Vital Signs BP 132/66 (BP Location: Left Arm)   Pulse 80   Temp 97.7 F (36.5 C) (Oral)   Resp 16   SpO2 96%   Visual Acuity Right Eye Distance:   Left Eye Distance:   Bilateral Distance:    Right Eye Near:   Left Eye Near:     Bilateral Near:     Physical Exam Constitutional:      Appearance: Normal appearance.  Cardiovascular:     Rate and Rhythm: Normal rate and regular rhythm.  Pulmonary:     Effort: Pulmonary effort is normal.     Breath sounds: Normal breath sounds.  Abdominal:     Comments: Left lower abdominal wall 3 mm indurated nodular lesion without erythema   Skin:    General: Skin is warm and dry.     Capillary Refill: Capillary refill takes less than 2 seconds.  Neurological:     General: No focal deficit present.     Mental Status: She is alert.  Psychiatric:        Mood and Affect: Mood normal.        Behavior: Behavior normal.   UC Treatments / Results  Labs (all labs ordered are listed, but only abnormal results are displayed) Labs Reviewed - No data to display  EKG   Radiology No results found.  Procedures Procedures (including critical care time)  Medications Ordered in UC Medications - No data to display  Initial Impression / Assessment and Plan / UC Course  I have reviewed the triage vital signs and the nursing notes.  Pertinent labs & imaging results that were available during my care of the patient were reviewed by me and considered in my medical decision making (see chart for details).    Infected tick bite of abdominal wall Doxycycline 100 mg twice daily x 7 days. Monitor for signs of infection.  Encouraged use of tick repellant  RTC or follow-up with PCP as needed.  Final Clinical Impressions(s) / UC Diagnoses   Final diagnoses:  Infected tick bite of abdominal wall, initial encounter     Discharge Instructions      Insect repellant with DEET included as this is protective against tick bites.     ED Prescriptions     Medication Sig Dispense Auth. Provider   doxycycline (VIBRAMYCIN) 100 MG capsule Take 1 capsule (100 mg total) by mouth 2 (two) times daily for 7 days. 14 capsule Scot Jun, FNP      PDMP not reviewed this  encounter.   Molli Barrows  S, FNP 01/22/22 1206

## 2022-01-22 NOTE — Discharge Instructions (Addendum)
Insect repellant with DEET included as this is protective against tick bites.

## 2022-01-22 NOTE — ED Triage Notes (Signed)
Pt removed a tick from her left side abdomen yesterday.

## 2022-02-05 ENCOUNTER — Ambulatory Visit
Admission: EM | Admit: 2022-02-05 | Discharge: 2022-02-05 | Disposition: A | Discharge status: Home / Self Care | Payer: PPO | Attending: Family Medicine | Admitting: Family Medicine

## 2022-02-05 DIAGNOSIS — N390 Urinary tract infection, site not specified: Secondary | ICD-10-CM

## 2022-02-05 LAB — POCT URINALYSIS DIP (MANUAL ENTRY)
Bilirubin, UA: NEGATIVE
Glucose, UA: NEGATIVE mg/dL
Nitrite, UA: NEGATIVE
Protein Ur, POC: 100 mg/dL — AB
Spec Grav, UA: 1.02 (ref 1.010–1.025)
Urobilinogen, UA: 0.2 E.U./dL
pH, UA: 5.5 (ref 5.0–8.0)

## 2022-02-05 MED ORDER — CEPHALEXIN 500 MG PO CAPS: 500.0000 mg | ORAL_CAPSULE | Freq: Two times a day (BID) | ORAL | 0 refills | Status: AC

## 2022-02-05 NOTE — ED Triage Notes (Signed)
Patient presents to Urgent Care with complaints of possible UTI. C/o of dysuria and odor. Taking pyridium.

## 2022-02-05 NOTE — Discharge Instructions (Addendum)
Your urine culture is pending.  Complete entire 10-day course of Keflex.  Since she recently had a urinary tract infection I am treating you for an entire 10 days to ensure resolution of infection.

## 2022-02-05 NOTE — ED Provider Notes (Signed)
[name]    CSN: [id number] Arrival date & time: 02/05/22  1041      History   Chief Complaint Chief Complaint  Patient presents with   Dysuria    HPI [name] is a 71 y.o. female.   HPI [name] is a 71 y.o. female presents for evaluation of urinary frequency, urgency and dysuria x 2 days, without flank pain, fever, chills, or abnormal vaginal discharge or bleeding. Last UTI 4/23, bacteria present Klebsiella and treated successfully with Keflex.  Reports all of prior symptoms resolved until 2 days ago. Past Medical History:  Diagnosis Date   Allergic rhinitis    Anxiety    Depression    Diverticulosis    HEMMORHOIDS ON COLONOSCOPY   IBS (irritable bowel syndrome)    DIARRHEA PRONE   OA (osteoarthritis)    HANDS   Skin cancer, basal cell    HX skin cancer per patient   TMJ syndrome    Vitamin D deficiency     Patient Active Problem List   Diagnosis Date Noted   Acute pancreatitis 05/31/2016   Pancreatitis 05/31/2016   Nausea and vomiting 05/31/2016   Leukocytosis 05/31/2016   Hyperglycemia 05/31/2016    Past Surgical History:  Procedure Laterality Date   BONE SPURS REMOVAL     TOES   BREAST BIOPSY     RIGHT   BREAST ENHANCEMENT SURGERY     THEN REMOVED   CHOLECYSTECTOMY N/A 06/01/2016   Procedure: LAPAROSCOPIC CHOLECYSTECTOMY WITH INTRAOPERATIVE CHOLANGIOGRAM;  Surgeon: Coralie Keens, MD;  Location: [place];  Service: General;  Laterality: N/A;   FOOT SURGERY     Right   SHOULDER SURGERY     RIGHT   TUBAL LIGATION     VESICOVAGINAL FISTULA CLOSURE W/ TAH     WITHOUT OOPHS    OB History   No obstetric history on file.      Home Medications    Prior to Admission medications   Medication Sig Start Date End Date Taking? Authorizing Provider  celecoxib (CELEBREX) 200 MG capsule celecoxib 200 mg capsule  TAKE 1 CAPSULE BY MOUTH EVERY DAY    [provider]  cephALEXin (KEFLEX) 500 MG capsule Take 1 capsule  (500 mg total) by mouth 2 (two) times daily for 10 days. 02/05/22 02/15/22 Yes Scot Jun, FNP  Cholecalciferol (VITAMIN D) 2000 UNITS CAPS Take 5,000 Units by mouth daily.     [provider]  nitrofurantoin, macrocrystal-monohydrate, (MACROBID) 100 MG capsule Take 1 capsule (100 mg total) by mouth 2 (two) times daily. 12/07/21   Crain, Whitney L, PA  ondansetron (ZOFRAN ODT) 4 MG disintegrating tablet '4mg'$  ODT q4 hours prn nausea/vomit 10/31/14   Milton Ferguson, MD  phenazopyridine (PYRIDIUM) 200 MG tablet Take 1 tablet (200 mg total) by mouth 3 (three) times daily as needed for pain. 12/07/21   Crain, Whitney L, PA  Polyvinyl Alcohol-Povidone (REFRESH OP) Apply 1-2 drops to eye daily as needed (dry eyes).    [provider]    Family History Family History  Problem Relation Age of Onset   Hypertension Mother    Cancer - Other Mother    Lung cancer Father    Cancer - Other Brother    CVA Brother    Heart attack Brother    AAA (abdominal aortic aneurysm) Sister    Depression Sister     Social History Social History   Tobacco Use   Smoking status: Never  Smokeless tobacco: Never  Vaping Use   Vaping Use: Never used  Substance Use Topics   Alcohol use: No   Drug use: No     Allergies   Ketorolac, Morphine, Oxycodone-acetaminophen, Pentazocine, Cefuroxime, Ketorolac tromethamine, Morphine and related, Nitrofurantoin, Percocet [oxycodone-acetaminophen], and Zoloft [sertraline hcl]   Review of Systems Review of Systems Pertinent negatives listed in HPI   Physical Exam Triage Vital Signs ED Triage Vitals  Enc Vitals Group     BP 02/05/22 1102 124/73     Pulse Rate 02/05/22 1102 77     Resp 02/05/22 1102 18     Temp 02/05/22 1102 (!) 97.5 F (36.4 C)     Temp Source 02/05/22 1102 Temporal     SpO2 02/05/22 1102 95 %     Weight --      Height --      Head Circumference --      Peak Flow --      Pain Score 02/05/22 1103 0     Pain Loc --       Pain Edu? --      Excl. in [place]? --    No data found.  Updated Vital Signs BP 124/73 (BP Location: Left Arm)   Pulse 77   Temp (!) 97.5 F (36.4 C) (Temporal)   Resp 18   SpO2 95%   Visual Acuity Right Eye Distance:   Left Eye Distance:   Bilateral Distance:    Right Eye Near:   Left Eye Near:    Bilateral Near:     Physical Exam  General appearance: Alert, well developed, well nourished, cooperative and  no distress Head: Normocephalic, without obvious abnormality, atraumatic Respiratory: Respirations even and unlabored, normal respiratory rate Heart: Rate and Rhythm normal.   CVA:  No flank pain Neurological: No focal neurological changes present on exam Skin: Skin color, texture, turgor normal. No rashes seen  Psych: Appropriate mood and affect.  UC Treatments / Results  Labs (all labs ordered are listed, but only abnormal results are displayed) Labs Reviewed  POCT URINALYSIS DIP (MANUAL ENTRY) - Abnormal; Notable for the following components:      Result Value   Clarity, UA cloudy (*)    Ketones, POC UA trace (5) (*)    Blood, UA moderate (*)    Protein Ur, POC =100 (*)    Leukocytes, UA Small (1+) (*)    All other components within normal limits  URINE CULTURE    EKG   Radiology No results found.  Procedures Procedures (including critical care time)  Medications Ordered in UC Medications - No data to display  Initial Impression / Assessment and Plan / UC Course  I have reviewed the triage vital signs and the nursing notes.  Pertinent labs & imaging results that were available during my care of the patient were reviewed by me and considered in my medical decision making (see chart for details).      UA abnormal and findings consistent with UTI.  Empiric antibiotic treatment initiated with Keflex, extending treatment for 10 days given last UTI was 6 weeks ago. Encouraged increase intake of water. Urine culture pending.  ER if symptoms become  severe.  Follow-up with PCP if symptoms do not completely resolve.  Final Clinical Impressions(s) / UC Diagnoses   Final diagnoses:  Acute UTI (urinary tract infection)     Discharge Instructions      Your urine culture is pending.  Complete entire 10-day course of Keflex.  Since she recently had a urinary tract infection I am treating you for an entire 10 days to ensure resolution of infection.     ED Prescriptions     Medication Sig Dispense Auth. Provider   cephALEXin (KEFLEX) 500 MG capsule Take 1 capsule (500 mg total) by mouth 2 (two) times daily for 10 days. 20 capsule Scot Jun, FNP      PDMP not reviewed this encounter.   Scot Jun, FNP 02/05/22 1137

## 2022-02-06 LAB — URINE CULTURE
Culture: <10000 — AB
Special Requests: NORMAL

## 2022-02-27 ENCOUNTER — Emergency Department (HOSPITAL_COMMUNITY)
Admission: EM | Admit: 2022-02-27 | Discharge: 2022-02-27 | Disposition: A | Discharge status: Home / Self Care | Payer: PPO | Attending: Emergency Medicine | Admitting: Emergency Medicine

## 2022-02-27 ENCOUNTER — Other Ambulatory Visit: Payer: Self-pay

## 2022-02-27 ENCOUNTER — Emergency Department (HOSPITAL_COMMUNITY): Payer: PPO

## 2022-02-27 ENCOUNTER — Encounter (HOSPITAL_COMMUNITY): Payer: Self-pay

## 2022-02-27 DIAGNOSIS — R410 Disorientation, unspecified: Secondary | ICD-10-CM | POA: Diagnosis not present

## 2022-02-27 DIAGNOSIS — R404 Transient alteration of awareness: Secondary | ICD-10-CM

## 2022-02-27 DIAGNOSIS — R4182 Altered mental status, unspecified: Secondary | ICD-10-CM | POA: Diagnosis not present

## 2022-02-27 DIAGNOSIS — D72829 Elevated white blood cell count, unspecified: Secondary | ICD-10-CM | POA: Diagnosis not present

## 2022-02-27 DIAGNOSIS — R791 Abnormal coagulation profile: Secondary | ICD-10-CM | POA: Diagnosis not present

## 2022-02-27 DIAGNOSIS — E778 Other disorders of glycoprotein metabolism: Secondary | ICD-10-CM | POA: Diagnosis not present

## 2022-02-27 DIAGNOSIS — R7309 Other abnormal glucose: Secondary | ICD-10-CM | POA: Insufficient documentation

## 2022-02-27 DIAGNOSIS — R0689 Other abnormalities of breathing: Secondary | ICD-10-CM | POA: Diagnosis not present

## 2022-02-27 DIAGNOSIS — R9082 White matter disease, unspecified: Secondary | ICD-10-CM | POA: Diagnosis not present

## 2022-02-27 LAB — URINALYSIS, ROUTINE W REFLEX MICROSCOPIC
Bacteria, UA: NONE SEEN
Bilirubin Urine: NEGATIVE
Glucose, UA: NEGATIVE mg/dL
Ketones, ur: 5 mg/dL — AB
Nitrite: NEGATIVE
Protein, ur: NEGATIVE mg/dL
Specific Gravity, Urine: 1.009 (ref 1.005–1.030)
pH: 7 (ref 5.0–8.0)

## 2022-02-27 LAB — RAPID URINE DRUG SCREEN, HOSP PERFORMED
Amphetamines: NOT DETECTED
Barbiturates: NOT DETECTED
Benzodiazepines: NOT DETECTED
Cocaine: NOT DETECTED
Opiates: NOT DETECTED
Tetrahydrocannabinol: NOT DETECTED

## 2022-02-27 LAB — CBC WITH DIFFERENTIAL/PLATELET
Abs Immature Granulocytes: 0.06 10*3/uL (ref 0.00–0.07)
Basophils Absolute: 0.1 10*3/uL (ref 0.0–0.1)
Basophils Relative: 1 %
Eosinophils Absolute: 0.1 10*3/uL (ref 0.0–0.5)
Eosinophils Relative: 0 %
HCT: 42.6 % (ref 36.0–46.0)
Hemoglobin: 13.6 g/dL (ref 12.0–15.0)
Immature Granulocytes: 1 %
Lymphocytes Relative: 16 %
Lymphs Abs: 1.8 10*3/uL (ref 0.7–4.0)
MCH: 28 pg (ref 26.0–34.0)
MCHC: 31.9 g/dL (ref 30.0–36.0)
MCV: 87.8 fL (ref 80.0–100.0)
Monocytes Absolute: 0.8 10*3/uL (ref 0.1–1.0)
Monocytes Relative: 6 %
Neutro Abs: 8.9 10*3/uL — ABNORMAL HIGH (ref 1.7–7.7)
Neutrophils Relative %: 76 %
Platelets: 278 10*3/uL (ref 150–400)
RBC: 4.85 MIL/uL (ref 3.87–5.11)
RDW: 12.9 % (ref 11.5–15.5)
WBC: 11.7 10*3/uL — ABNORMAL HIGH (ref 4.0–10.5)
nRBC: 0 % (ref 0.0–0.2)

## 2022-02-27 LAB — COMPREHENSIVE METABOLIC PANEL
ALT: 22 U/L (ref 0–44)
AST: 23 U/L (ref 15–41)
Albumin: 3.7 g/dL (ref 3.5–5.0)
Alkaline Phosphatase: 99 U/L (ref 38–126)
Anion gap: 12 (ref 5–15)
BUN: 7 mg/dL — ABNORMAL LOW (ref 8–23)
CO2: 23 mmol/L (ref 22–32)
Calcium: 9.5 mg/dL (ref 8.9–10.3)
Chloride: 108 mmol/L (ref 98–111)
Creatinine, Ser: 0.71 mg/dL (ref 0.44–1.00)
GFR, Estimated: >60 mL/min (ref >60)
Glucose, Bld: 100 mg/dL — ABNORMAL HIGH (ref 70–99)
Potassium: 3.4 mmol/L — ABNORMAL LOW (ref 3.5–5.1)
Sodium: 143 mmol/L (ref 135–145)
Total Bilirubin: 1.1 mg/dL (ref 0.3–1.2)
Total Protein: 5.9 g/dL — ABNORMAL LOW (ref 6.5–8.1)

## 2022-02-27 LAB — AMMONIA: Ammonia: 14 umol/L (ref 9–35)

## 2022-02-27 LAB — CBG MONITORING, ED: Glucose-Capillary: 86 mg/dL (ref 70–99)

## 2022-02-27 LAB — PROTIME-INR
INR: 1.1 (ref 0.8–1.2)
Prothrombin Time: 13.8 seconds (ref 11.4–15.2)

## 2022-02-27 LAB — APTT: aPTT: 23 seconds — ABNORMAL LOW (ref 24–36)

## 2022-02-27 NOTE — Discharge Instructions (Addendum)
You came to the emergency department today to be evaluated for your acute mental status change.  Your mental status has returned to baseline.  The MRI of your brain did not show any acute intracranial abnormalities.  The remainder of your work-up was reassuring.  I have placed a referral to [facility] neurology.  They should contact you in 2-3 business days.  If you do not hear from them in 2-3 business days please use the information on his paperwork to schedule follow-up appointment.  Additionally please follow-up closely with your primary care doctor for repeat assessment.  Get help right away if: You have chest pain. You have a heartbeat that is not regular. You have any signs of a stroke. "BE FAST" is an easy way to remember the main warning signs: B - Balance. Dizziness, sudden trouble walking, or loss of balance. E - Eyes. Trouble seeing or a change in how you see. F - Face. Sudden weakness or loss of feeling of the face. The face or eyelid may droop on one side. A - Arms. Weakness or loss of feeling in an arm. This happens all of a sudden and most often on one side of the body. S - Speech. Sudden trouble speaking, slurred speech, or trouble understanding what people say. T - Time. Time to call emergency services. Write down what time symptoms started. You have other signs of a stroke, such as: A sudden, very bad headache with no known cause. Feeling like you may vomit. Vomiting. A seizure.

## 2022-02-27 NOTE — ED Triage Notes (Signed)
  Pt arrived via [facility] from home, pt son noticed that she was not acting like herself. Pt dx with UTI a week ago. No neuro def. Displayed in the field. No C/o of pain. Pt was AO x3. Pt does not remember what happened or why she is here. 1L of LR administered via 18g on LAC.   Last vital signs HR 96  BP 154/86  CPG 108  T 100 tempanic  98% RA  RR 20

## 2022-02-27 NOTE — ED Provider Notes (Signed)
[facility] EMERGENCY DEPARTMENT Provider Note   CSN: [id number] Arrival date & time: 02/27/22  1216     History Chief Complaint  Patient presents with   Altered Mental Status    [name] is a 71 y.o. female with h/o UTIs and GERD presents to the ED with family members for evaluation of AMS. The patient's husband reports that they were awakened around 0530 today.  He reports that the wife woke up to make coffee and they were sitting drinking her coffee when they decided that they wanted to go back to bed.  The has not reports that when the wife was laying back in bed she was confused and asked how she ended up back in the bed.  She knows her family numbers, but was disoriented to place and time.  She does not remember recent events.  Her son mentions that they had a family member that was just had surgery for cancer and has been fighting cancer for the past 2 years, however when the patient was asked about it she did not remember that the feeling of her had cancer much less had recent surgery.  Her husband reports that she had a UTI a few weeks ago that was treated.  He denies any other medical conditions other than GERD and UTIs.  She was taking Azo's and omeprazole.  He denies any previous history of any altered mental status.  Denies any tobacco, EtOH, or illicit drug ever.  The patient denies any chest pain, shortness of breath, palpitations, abdominal pain, nausea, vomiting, fever, weakness, or recent illness.   Altered Mental Status      Home Medications Prior to Admission medications   Medication Sig Start Date End Date Taking? Authorizing Provider  celecoxib (CELEBREX) 200 MG capsule celecoxib 200 mg capsule  TAKE 1 CAPSULE BY MOUTH EVERY DAY    [provider]  Cholecalciferol (VITAMIN D) 2000 UNITS CAPS Take 5,000 Units by mouth daily.     [provider]  nitrofurantoin, macrocrystal-monohydrate, (MACROBID) 100 MG capsule Take 1  capsule (100 mg total) by mouth 2 (two) times daily. 12/07/21   Crain, Whitney L, PA  ondansetron (ZOFRAN ODT) 4 MG disintegrating tablet '4mg'$  ODT q4 hours prn nausea/vomit 10/31/14   Milton Ferguson, MD  phenazopyridine (PYRIDIUM) 200 MG tablet Take 1 tablet (200 mg total) by mouth 3 (three) times daily as needed for pain. 12/07/21   Crain, Whitney L, PA  Polyvinyl Alcohol-Povidone (REFRESH OP) Apply 1-2 drops to eye daily as needed (dry eyes).    [provider]      Allergies    Ketorolac, Morphine, Oxycodone-acetaminophen, Pentazocine, Cefuroxime, Ketorolac tromethamine, Morphine and related, Nitrofurantoin, Percocet [oxycodone-acetaminophen], and Zoloft [sertraline hcl]    Review of Systems   Review of Systems  Unable to perform ROS: Mental status change    Physical Exam Updated Vital Signs BP 139/70   Pulse 72   Temp 98.1 F (36.7 C) (Oral)   Resp 14   Ht '5\' 4"'$  (1.626 m)   Wt 71.2 kg   SpO2 98%   BMI 26.94 kg/m  Physical Exam Vitals and nursing note reviewed.  Constitutional:      General: She is not in acute distress.    Appearance: Normal appearance. She is not ill-appearing or toxic-appearing.     Comments: Pleasant appearing in no acute distress.  HENT:     Head: Normocephalic and atraumatic.  Eyes:     General: No scleral icterus.  Cardiovascular:     Rate and Rhythm: Normal rate and regular rhythm.  Pulmonary:     Effort: Pulmonary effort is normal. No respiratory distress.     Breath sounds: Normal breath sounds.  Abdominal:     Palpations: Abdomen is soft.     Tenderness: There is no abdominal tenderness.  Musculoskeletal:        General: No deformity.     Cervical back: Normal range of motion.  Skin:    General: Skin is warm and dry.  Neurological:     General: No focal deficit present.     Mental Status: She is alert.     GCS: GCS eye subscore is 4. GCS verbal subscore is 5. GCS motor subscore is 6.     Cranial Nerves: Cranial nerves 2-12 are  intact. No cranial nerve deficit, dysarthria or facial asymmetry.     Sensory: No sensory deficit.     Motor: No weakness or pronator drift.     Coordination: Finger-Nose-Finger Test normal.     Comments: Patient is oriented to person only and thinks that is is June 2024. She did not know where she was, but did know the people.  GCS 15.  Cranial nerves intact.  No facial asymmetry.  Patient is answering questions appropriate with appropriate speech.  Sensation intact throughout.  Strength out of 5 in patient's upper and lower bilateral extremities.  No pronator drift.  Normal finger-nose. She is able to identify objects around the room.     ED Results / Procedures / Treatments   Labs (all labs ordered are listed, but only abnormal results are displayed) Labs Reviewed  CBC WITH DIFFERENTIAL/PLATELET - Abnormal; Notable for the following components:      Result Value   WBC 11.7 (*)    Neutro Abs 8.9 (*)    All other components within normal limits  APTT - Abnormal; Notable for the following components:   aPTT 23 (*)    All other components within normal limits  URINE CULTURE  PROTIME-INR  COMPREHENSIVE METABOLIC PANEL  URINALYSIS, ROUTINE W REFLEX MICROSCOPIC  AMMONIA  RAPID URINE DRUG SCREEN, HOSP PERFORMED  CBG MONITORING, ED    EKG None  Radiology CT Head Wo Contrast  Result Date: 02/27/2022 CLINICAL DATA:  Mental status change, unknown cause EXAM: CT HEAD WITHOUT CONTRAST TECHNIQUE: Contiguous axial images were obtained from the base of the skull through the vertex without intravenous contrast. RADIATION DOSE REDUCTION: This exam was performed according to the departmental dose-optimization program which includes automated exposure control, adjustment of the mA and/or kV according to patient size and/or use of iterative reconstruction technique. COMPARISON:  None Available. FINDINGS: Brain: No evidence of acute infarction, hemorrhage, hydrocephalus, extra-axial collection or mass  lesion/mass effect. Vascular: No hyperdense vessel or unexpected calcification. Skull: Normal. Negative for fracture or focal lesion. Sinuses/Orbits: Small RIGHT mastoid effusion. Other: None. IMPRESSION: No acute intracranial abnormality. Electronically Signed   By: Valentino Saxon M.D.   On: 02/27/2022 14:17    Procedures Procedures   Medications Ordered in ED Medications - No data to display  ED Course/ Medical Decision Making/ A&P                           Medical Decision Making Amount and/or Complexity of Data Reviewed Labs: ordered. Radiology: ordered.   71 year old female presents emerged [facility] for evaluation of acute altered mental status.  Differential diagnosis includes was not limited to electrolyte  abnormality, encephalopathy, UTI, stroke, TIA, dementia, sepsis.  Vitals show slightly elevated blood pressure otherwise normal.  Physical exam as shown above.  Work-up for acute altered mental status orders placed including CT imaging and labs.  I independently reviewed and interpreted the patient's labs.  CMP shows mild decrease in potassium at 3.4.  Mild increase glucose at 100 although not fasting.  BUN at 7 although normal creatinine.  Decrease in total protein at 5.9 other than wise normal LFTs.  Ammonia is normal.  PT/INR is within normal limits.  APTT slightly decreased at 23.  CBG normal 86.  CBC slightly increased with a leukocytosis at 11.7 otherwise no anemia.  Urine culture pending.  Urinalysis and UDS in process.  CT of the head shows no acute intercranial abnormality.  Chest x-ray pending.  4:20 PM Care of [name] transferred to [facility] at the end of my shift as the patient will require reassessment once labs/imaging have resulted. Patient presentation, ED course, and plan of care discussed with review of all pertinent labs and imaging. Please see his/her note for further details regarding further ED course and disposition. Plan at time of  handoff is follow up with labs and possible consult to neurology . This may be altered or completely changed at the discretion of the oncoming team pending results of further workup.  Final Clinical Impression(s) / ED Diagnoses Final diagnoses:  None    Rx / DC Orders ED Discharge Orders     None         Sherrell Puller, PA-C 02/27/22 1628    Tegeler, Gwenyth Allegra, MD 02/28/22 [phone]

## 2022-02-27 NOTE — ED Notes (Signed)
Discharge instructions reviewed with patient. Patient verbalized understanding of instructions. Follow-up care and medications were reviewed. Patient ambulatory with steady gait. VSS upon discharge.  ?

## 2022-02-27 NOTE — ED Provider Notes (Signed)
Care of patient assumed from [name] at 1500.  Agree with history, physical exam and plan.  See their note for further details. Briefly, 71 year old female with a history of frequent UTIs brought to the emergency department with a complaint of altered mental status.  Patient had acute change in mental status this morning at approximately 5:30 AM.  Altered mental status lasted for multiple hours before returning to normal.  Patient was recently treated for urinary tract infection with antibiotics.  Patient is also taking Azo's, mannose, and omeprazole.   Physical Exam  BP 139/70   Pulse 72   Temp 98.1 F (36.7 C) (Oral)   Resp 14   Ht '5\' 4"'$  (1.626 m)   Wt 71.2 kg   SpO2 98%   BMI 26.94 kg/m   Physical Exam Vitals and nursing note reviewed.  Constitutional:      General: She is not in acute distress.    Appearance: She is not ill-appearing, toxic-appearing or diaphoretic.  HENT:     Head: Normocephalic.  Eyes:     General: No scleral icterus.       Right eye: No discharge.        Left eye: No discharge.  Cardiovascular:     Rate and Rhythm: Normal rate.  Pulmonary:     Effort: Pulmonary effort is normal.  Skin:    General: Skin is warm and dry.  Neurological:     General: No focal deficit present.     Mental Status: She is alert and oriented to person, place, and time.     GCS: GCS eye subscore is 4. GCS verbal subscore is 5. GCS motor subscore is 6.  Psychiatric:        Behavior: Behavior is cooperative.     Procedures  Procedures  ED Course / MDM    Medical Decision Making Amount and/or Complexity of Data Reviewed Labs: ordered. Radiology: ordered.   Upon arrival in the emergency department patient was alert to person only.  Patient had no focal neurological deficits on neurologic exam from previous provider.  At time of handoff lab work pending.  On my examination patient is alert to person, place, and time.  Patient does not have clear recollection of being  confused or the events that occurred prior to her arrival in the emergency department.  Denies any recent falls or traumatic injuries.  The only new medication patient has taken are d-mannose and Azo after her urinary tract infection.  I personally viewed and interpret patient's lab results.  Pertinent findings include: -No large electrolyte abnormality -Slight leukocytosis at 11.7 -CBG 86 -Ammonia 14 -Urinalysis shows no signs of infection -UDS unremarkable  I personally viewed interpret patient's chest x-ray.  Imaging shows no active cardiopulmonary disease.  On serial reexamination patient continues to have no focal neurological deficit.  Continues to be alert to person, place, and time.  Patient has ABCD 2 score of 4, will reach out to neurology team due to her sudden onset of altered mental status.  I spoke to neurologist Dr. Malen Gauze who recommended obtaining MRI brain.  If MRI imaging is unremarkable we will plan to discharge to follow-up with neurology in the outpatient setting.  MRI imaging shows no acute intracranial abnormality.  On serial reexamination patient continues to have no focal neurological deficits and is alert to her baseline.  Will discharge patient to follow-up with neurology and her PCP at this time.  Distress strict return precautions with patient, patient's husband, and patient's  family members.  Patient care discussed with attending physician Dr. Rogene Houston.  Based on patient's chief complaint, I considered admission might be necessary, however after reassuring ED workup feel patient is reasonable for discharge.  Discussed results, findings, treatment and follow up. Patient advised of return precautions. Patient verbalized understanding and agreed with plan.  Portions of this note were generated with [employer]obby[employer]. Dictation errors may occur despite best attempts at proofreading.    Loni Beckwith, PA-C 02/27/22 2344    Fredia Sorrow,  MD 02/28/22 [phone]

## 2022-02-28 LAB — URINE CULTURE: Culture: NO GROWTH

## 2022-03-01 ENCOUNTER — Encounter: Payer: Self-pay | Admitting: Physician Assistant

## 2022-03-17 DIAGNOSIS — H35033 Hypertensive retinopathy, bilateral: Secondary | ICD-10-CM | POA: Diagnosis not present

## 2022-03-17 DIAGNOSIS — H2513 Age-related nuclear cataract, bilateral: Secondary | ICD-10-CM | POA: Diagnosis not present

## 2022-03-17 DIAGNOSIS — H524 Presbyopia: Secondary | ICD-10-CM | POA: Diagnosis not present

## 2022-03-17 DIAGNOSIS — H5213 Myopia, bilateral: Secondary | ICD-10-CM | POA: Diagnosis not present

## 2022-03-19 ENCOUNTER — Ambulatory Visit: Payer: PPO | Admitting: Physician Assistant

## 2022-03-19 ENCOUNTER — Encounter: Payer: Self-pay | Admitting: Physician Assistant

## 2022-03-19 VITALS — BP 141/70 | HR 80 | Resp 18 | Ht 63.0 in | Wt 165.0 lb

## 2022-03-19 DIAGNOSIS — R002 Palpitations: Secondary | ICD-10-CM

## 2022-03-19 DIAGNOSIS — R404 Transient alteration of awareness: Secondary | ICD-10-CM | POA: Diagnosis not present

## 2022-03-19 NOTE — Progress Notes (Addendum)
[facility] Clinic Note - Initial Visit   Date: 03/19/22  [name] MRN: [id number] DOB: 01-Sep-1950   Dear Dr Harlan Stains, MD:  Thank you for your kind referral of [name] for consultation of transient alteration of awareness. Although her history is well known to you, please allow [place] to reiterate it for the purpose of our medical record.    ASSESSMENT AND PLAN This is a very pleasant 71 year old woman with a history of anxiety, depression, diverticulosis, IBS, intermittent diarrhea, osteoarthritis, TMJ, vitamin D deficiency as well as a history of frequent UTIs (last one on 4/23 for Klebsiella treated with Keflex and a recurrent one and February 05, 2022) who presented to the emergency department on 02/27/2022 with a complaint of altered mental status after being treated with antibiotics for UTI.  Labs were without large electrolyte abnormality, slight leukocytosis at 11.7, CBG of 86, and ammonia 14.  UA was negative for UTI at this time.  Chest x-ray was negative for acute cardiopulmonary disease.  Neurological evaluation was obtained, without neurological deficits noted.  She has an ABCD 2 score of 4, MRI of the brain negative for acute findings, but chronic mild to moderate microvascular ischemic changes were noted, without a stroke.  Other work-up is in progress.  Total global amnesia is suspected, although other neurologic abnormalities need to be entertained.  EEG to rule out seizure activity Control of cardiovascular risk factors Increase oral water intake Follow-up in 3 months     History of Present Illness: Patient reports that the day prior to her symptoms, she was working in the yard, and it was very hot.  "I am not sure I had a heat stroke ".  She reports not drinking enough water.  When she went to sleep that night, she slept well.  She wakes up in the morning, but since then, does not remember any of the events.  Apparently, the  patient had gone to the bathroom, and was not sure why she walked there.  She does not remember drinking coffee with her husband.  On conversations, she cannot remember any of them.  Her husband states that he talked about her nephew having had surgery, her has not plans for surgery, etc.  She states that she does not recall being brought by EMS to the hospital, she woke up around 2:20 PM at the ED.  At the time, her family had informed of all the events, but she does not remember.  After a while, as the family began to explain what happened, "everything came back to me ".  She did find herself asking the same questions.  She reports never having an episode like this.  They did an MRI of the brain, which was negative for acute findings.  Any history of seizures seizure:Never  Witnessed?  Husband and son  Tremor:  no  Voice: no  Hallucinations:   visual distortions no              Auditory hallucinations? No   Taste of blood or metallic taste? No  Nausea and Vomiting? No  Lightheadedness/ Dizziness? No   Syncope? No   Diplopia or other visual changes? No  History of encephalitis or meningitis? No  History of Headaches? No  Head Trauma/Injuries? Age 24 had a trauma to the head w LOC L parietoccipital area  History of anxiety  Loss of smell:  no  Loss of taste :  no  Urinary or Bowel Incontinence :  no  Difficulty Swallowing ? No  Trouble with ADL's? No   Trouble buttoning clothing?no   Mood Changes? Depression. Overwhelmed by the trees that she is use to see from her window being turned down about 6 weeks ago. Memory changes: denies  Sleep:  How many hours? 4 to 6 hours               Rested upon waking up? Yes  ETOH? No  Tobacco? No  Recreational Drugs? No    Caffeine? Postural symptoms:    Falls?  No   Patient  never had a similar episode. Denies any history of TIA. She has a history of BPV not recently. Denies headaches, dysarthria or dysphagia. Denies any chest pain, or shortness  of breath. She has palps during that episode ( up to 120)  Denies any fever or chills, but she did have an episode of sweating, "I feel my body heats up lasting a few seconds and resolved ".  No new meds or hormonal supplements. Does not take a regular ASA a day. Denies any recent long distance trips or recent surgeries. No sick contacts but she did have a tick felt to be due to a deer tick, and she was treated with doxycycline several weeks ago.  Patient is compliant with his medications.  Patient is very active, ride bicycle, exercising daily.  she not a diabetic. family history of stroke in her brother   Out-side paper records, electronic medical record, and images have been reviewed where available and summarized as:     Past Medical History:  Diagnosis Date   Allergic rhinitis    Anxiety    Depression    Diverticulosis    HEMMORHOIDS ON COLONOSCOPY   IBS (irritable bowel syndrome)    DIARRHEA PRONE   OA (osteoarthritis)    HANDS   Skin cancer, basal cell    HX skin cancer per patient   TMJ syndrome    Vitamin D deficiency     Past Surgical History:  Procedure Laterality Date   BONE SPURS REMOVAL     TOES   BREAST BIOPSY     RIGHT   BREAST ENHANCEMENT SURGERY     THEN REMOVED   CHOLECYSTECTOMY N/A 06/01/2016   Procedure: LAPAROSCOPIC CHOLECYSTECTOMY WITH INTRAOPERATIVE CHOLANGIOGRAM;  Surgeon: Coralie Keens, MD;  Location: [facility];  Service: General;  Laterality: N/A;   FOOT SURGERY     Right   SHOULDER SURGERY     RIGHT   TUBAL LIGATION     VESICOVAGINAL FISTULA CLOSURE W/ TAH     WITHOUT OOPHS     Medications:  Outpatient Encounter Medications as of 03/19/2022  Medication Sig   celecoxib (CELEBREX) 200 MG capsule celecoxib 200 mg capsule  TAKE 1 CAPSULE BY MOUTH EVERY DAY   Cholecalciferol (VITAMIN D) 2000 UNITS CAPS Take 5,000 Units by mouth daily.    ondansetron (ZOFRAN ODT) 4 MG disintegrating tablet '4mg'$  ODT q4 hours prn nausea/vomit   Polyvinyl  Alcohol-Povidone (REFRESH OP) Apply 1-2 drops to eye daily as needed (dry eyes).   nitrofurantoin, macrocrystal-monohydrate, (MACROBID) 100 MG capsule Take 1 capsule (100 mg total) by mouth 2 (two) times daily. (Patient not taking: Reported on 03/19/2022)   phenazopyridine (PYRIDIUM) 200 MG tablet Take 1 tablet (200 mg total) by mouth 3 (three) times daily as needed for pain. (Patient not taking: Reported on 03/19/2022)   No facility-administered encounter medications on file as of 03/19/2022.    Allergies:  Allergies  Allergen  Reactions   Ketorolac Nausea Only   Morphine Nausea Only and Other (See Comments)    Jumpy     Oxycodone-Acetaminophen Nausea Only and Nausea And Vomiting   Pentazocine Nausea Only   Cefuroxime Other (See Comments)    Back pain   Ketorolac Tromethamine Nausea And Vomiting   Morphine And Related Other (See Comments)    Jumpy    Nitrofurantoin Other (See Comments)    Back pain   Percocet [Oxycodone-Acetaminophen] Nausea And Vomiting   Zoloft [Sertraline Hcl]     DIARRHEA     Family History: Family History  Problem Relation Age of Onset   Hypertension Mother    Cancer - Other Mother    Lung cancer Father    Cancer - Other Brother    CVA Brother    Heart attack Brother    AAA (abdominal aortic aneurysm) Sister    Depression Sister     Social History: Social History   Tobacco Use   Smoking status: Never   Smokeless tobacco: Never  Vaping Use   Vaping Use: Never used  Substance Use Topics   Alcohol use: No   Drug use: No   Social History   Social History Narrative   Right handed   Dirnks caffeine on occ   One story home    Vital Signs:  BP (!) 141/70   Pulse 80   Resp 18   Ht '5\' 3"'$  (1.6 m)   Wt 165 lb (74.8 kg)   SpO2 97%   BMI 29.23 kg/m    General Medical Exam:   General:  Well appearing, comfortable, mildly anxious appearing.   Eyes/ENT: see cranial nerve examination.   Neck:   No carotid bruits. Respiratory:  Clear to  auscultation, good air entry bilaterally.   Cardiac:  Regular rate and rhythm, no murmur.   Extremities:  No deformities, edema, or skin discoloration.  Skin:  No rashes or lesions.  Neurological Exam: MENTAL STATUS including orientation to time, place, person, recent and remote memory, attention span and concentration, language, and fund of knowledge is normal.   Speech is not dysarthric.  CRANIAL NERVES: II:  No visual field defects.  Unremarkable fundi.   III-IV-VI: Pupils equal round and reactive to light.  Normal conjugate, extra-ocular eye movements in all directions of gaze.  No nystagmus.  No ptosis.   V:  Normal facial sensation.    VII:  Normal facial symmetry and movements.   VIII:  Normal hearing and vestibular function.   IX-X:  Normal palatal movement.   XI:  Normal shoulder shrug and head rotation.   XII:  Normal tongue strength and range of motion, no deviation or fasciculation.  MOTOR:  No atrophy, fasciculations or abnormal movements.  No pronator drift.    SENSORY:  Normal and symmetric perception of light touch, pinprick, vibration, and proprioception.  Romberg's sign absent.   COORDINATION/GAIT:  finger-to- nose-finger and heel-to-shin. rapid alternating movements bilaterally.  Able to rise from a chair without using arms.  Gait narrow based and stable. Tandem and stressed gait intact.   Total time spent: 50 min   Thank you for allowing me to participate in patient's care.  If I can answer any additional questions, I would be pleased to do so.    Sincerely,    Sharene Butters

## 2022-03-19 NOTE — Addendum Note (Signed)
Addended by: Renae Gloss on: 03/19/2022 03:32 PM   Modules accepted: Orders

## 2022-03-19 NOTE — Patient Instructions (Addendum)
EEG Echocardiogram EKG Follow up in 6 months  Drink plenty of water

## 2022-03-23 ENCOUNTER — Other Ambulatory Visit: Payer: Self-pay

## 2022-03-23 DIAGNOSIS — R410 Disorientation, unspecified: Secondary | ICD-10-CM | POA: Diagnosis not present

## 2022-03-23 DIAGNOSIS — E876 Hypokalemia: Secondary | ICD-10-CM | POA: Diagnosis not present

## 2022-03-23 NOTE — Addendum Note (Signed)
Addended by: Renae Gloss on: 03/23/2022 04:03 PM   Modules accepted: Orders

## 2022-04-20 ENCOUNTER — Telehealth: Payer: Self-pay | Admitting: Physician Assistant

## 2022-04-20 NOTE — Telephone Encounter (Signed)
April called in after getting a referral from our office. She stated she doesn't see anything on the referral about if we have gotten prior authorization from the insurance before they can schedule it.

## 2022-04-22 ENCOUNTER — Ambulatory Visit (HOSPITAL_COMMUNITY)
Admission: RE | Admit: 2022-04-22 | Discharge: 2022-04-22 | Disposition: A | Discharge status: Home / Self Care | Payer: PPO | Source: Ambulatory Visit | Attending: Physician Assistant | Admitting: Physician Assistant

## 2022-04-22 DIAGNOSIS — R404 Transient alteration of awareness: Secondary | ICD-10-CM | POA: Diagnosis not present

## 2022-04-22 NOTE — Progress Notes (Signed)
EEG complete - results pending 

## 2022-04-30 ENCOUNTER — Telehealth: Payer: Self-pay | Admitting: Physician Assistant

## 2022-04-30 NOTE — Telephone Encounter (Signed)
Called and reported EEG results per Alford Highland. She understood

## 2022-04-30 NOTE — Telephone Encounter (Signed)
Pt called in stating she had been speaking with someone else earlier, but wanted to find out if she can get her EEG results

## 2022-04-30 NOTE — Procedures (Signed)
ELECTROENCEPHALOGRAM REPORT  Date of Study: 04/22/2022  Patient's Name: [name] MRN: [id number] Date of Birth: [birth year]  Referring Provider: Sharene Butters, PA-C  Clinical History: This is a 71 year old woman with an episode of altered mental status. EEG for classification.  Medications: Celebrex, Nitrofurantoin, Zofran, Pyridium  Technical Summary: A multichannel digital EEG recording measured by the international 10-20 system with electrodes applied with paste and impedances below 5000 ohms performed in our laboratory with EKG monitoring in an awake and drowsy patient.  Hyperventilation and photic stimulation were performed.  The digital EEG was referentially recorded, reformatted, and digitally filtered in a variety of bipolar and referential montages for optimal display.    Description: The patient is awake and drowsy during the recording.  During maximal wakefulness, there is a symmetric, medium voltage 10 Hz posterior dominant rhythm that attenuates with eye opening.  The record is symmetric.  During drowsiness, there is an increase in theta slowing of the background. Sleep was not captured. Hyperventilation and photic stimulation did not elicit any abnormalities.  There were no epileptiform discharges or electrographic seizures seen.    EKG lead was unremarkable.  Impression: This awake and drowsy EEG is normal.    Clinical Correlation: A normal EEG does not exclude a clinical diagnosis of epilepsy.  If further clinical questions remain, prolonged EEG may be helpful.  Clinical correlation is advised.   Ellouise Newer, M.D.

## 2022-04-30 NOTE — Progress Notes (Signed)
EEG is normal, no seizure noted thanks

## 2022-05-05 NOTE — Telephone Encounter (Signed)
Pt is returning a call  please call

## 2022-05-06 NOTE — Telephone Encounter (Signed)
Called Pt back and she wanted to see Clarise Cruz earlier but she will just wait until 05/07/22 her appointment date. Said her heart rate is going up and down. I told her she may want to call her PCP about that. Said she will see up tomorrow.

## 2022-05-07 ENCOUNTER — Ambulatory Visit: Payer: PPO | Admitting: Cardiology

## 2022-05-07 ENCOUNTER — Ambulatory Visit: Payer: PPO

## 2022-05-07 VITALS — BP 135/76 | HR 85 | Temp 97.6°F | Resp 16 | Ht 63.0 in | Wt 168.4 lb

## 2022-05-07 DIAGNOSIS — R002 Palpitations: Secondary | ICD-10-CM

## 2022-05-07 DIAGNOSIS — G454 Transient global amnesia: Secondary | ICD-10-CM | POA: Diagnosis not present

## 2022-05-07 DIAGNOSIS — R404 Transient alteration of awareness: Secondary | ICD-10-CM

## 2022-05-07 NOTE — Progress Notes (Signed)
Primary Physician/Referring:  Harlan Stains, MD  Patient ID: Kathi Ludwig, female    DOB: 04-16-1951, 71 y.o.   MRN: [id number]  Chief Complaint  Patient presents with   Palpitations        New Patient (Initial Visit)    Referred by Sharene Butters, PA-C   HPI:    AILEN STRAUCH  is a 71 y.o. with history of GERD and UTIs that was referred to Cardiology by Neuro after episode of transient amnesia. She reports that on the morning of July 1 she woke up to make coffee with her husband and decided that they wanted to go back to bed. When laying back in bed she was confused and asked how she ended up back in the bed. She was disoriented to place and time. She returned to her usual state of health and mental status at approximately 2pm that afternoon. In the ED her workup including head CT and MRI, blood work, and urine studies did not reveal any acute processes including stroke. EEG was negative for seizure activity.   She denies chest pain, shortness of breath, dizziness, or lightheadedness. She is active and walks daily.  Past Medical History:  Diagnosis Date   Allergic rhinitis    Anxiety    Depression    Diverticulosis    HEMMORHOIDS ON COLONOSCOPY   IBS (irritable bowel syndrome)    DIARRHEA PRONE   OA (osteoarthritis)    HANDS   Skin cancer, basal cell    HX skin cancer per patient   TMJ syndrome    Vitamin D deficiency    Past Surgical History:  Procedure Laterality Date   BONE SPURS REMOVAL     TOES   BREAST BIOPSY     RIGHT   BREAST ENHANCEMENT SURGERY     THEN REMOVED   CHOLECYSTECTOMY N/A 06/01/2016   Procedure: LAPAROSCOPIC CHOLECYSTECTOMY WITH INTRAOPERATIVE CHOLANGIOGRAM;  Surgeon: Coralie Keens, MD;  Location: [facility];  Service: General;  Laterality: N/A;   FOOT SURGERY     Right   SHOULDER SURGERY     RIGHT   TUBAL LIGATION     VESICOVAGINAL FISTULA CLOSURE W/ TAH     WITHOUT OOPHS   Family History  Problem Relation Age of Onset   Hypertension  Mother    Cancer - Other Mother    Lung cancer Father    Cancer - Other Brother    CVA Brother    Heart attack Brother    AAA (abdominal aortic aneurysm) Sister    Depression Sister     Social History   Tobacco Use   Smoking status: Never   Smokeless tobacco: Never  Substance Use Topics   Alcohol use: No   Marital Status: Married  ROS  Review of Systems  Cardiovascular:  Negative for chest pain, dyspnea on exertion, leg swelling and palpitations.  Psychiatric/Behavioral:  Negative for altered mental status.    Objective      05/07/2022   11:47 AM 03/19/2022    9:50 AM 02/27/2022   11:00 PM  Vitals with BMI  Height $Remov'5\' 3"'ZuEFeO$  $Remove'5\' 3"'wfaVbdE$    Weight 168 lbs 6 oz 165 lbs   BMI 89.37 34.28   Systolic 768 115 726  Diastolic 76 70 58  Pulse 85 80 73   Today's Vitals   05/07/22 1147  BP: 135/76  Pulse: 85  Resp: 16  Temp: 97.6 F (36.4 C)  TempSrc: Temporal  SpO2: 98%  Weight: 168 lb 6.4 oz (  76.4 kg)  Height: 5' 3" (1.6 m)   Body mass index is 29.83 kg/m.  Physical Exam Cardiovascular:     Rate and Rhythm: Normal rate and regular rhythm.     Pulses: Normal pulses.     Heart sounds: Normal heart sounds. No murmur heard. Pulmonary:     Effort: Pulmonary effort is normal.     Breath sounds: Normal breath sounds.  Musculoskeletal:     Right lower leg: No edema.     Left lower leg: No edema.  Neurological:     Mental Status: She is alert.     Medications and allergies   Allergies  Allergen Reactions   Ketorolac Nausea Only   Morphine Nausea Only and Other (See Comments)    Jumpy     Oxycodone-Acetaminophen Nausea Only and Nausea And Vomiting   Pentazocine Nausea Only   Cefuroxime Other (See Comments)    Back pain   Ketorolac Tromethamine Nausea And Vomiting   Morphine And Related Other (See Comments)    Jumpy    Nitrofurantoin Other (See Comments)    Back pain   Percocet [Oxycodone-Acetaminophen] Nausea And Vomiting   Zoloft [Sertraline Hcl]     DIARRHEA       Medication list after today's encounter   Current Outpatient Medications:    celecoxib (CELEBREX) 200 MG capsule, celecoxib 200 mg capsule  TAKE 1 CAPSULE BY MOUTH EVERY DAY, Disp: , Rfl:    Cholecalciferol (VITAMIN D) 2000 UNITS CAPS, Take 5,000 Units by mouth daily. , Disp: , Rfl:    Polyvinyl Alcohol-Povidone (REFRESH OP), Apply 1-2 drops to eye daily as needed (dry eyes)., Disp: , Rfl:   Laboratory examination:   Lab Results  Component Value Date   NA 143 02/27/2022   K 3.4 (L) 02/27/2022   CO2 23 02/27/2022   GLUCOSE 100 (H) 02/27/2022   BUN 7 (L) 02/27/2022   CREATININE 0.71 02/27/2022   CALCIUM 9.5 02/27/2022   GFRNONAA >60 02/27/2022       Latest Ref Rng & Units 02/27/2022    2:33 PM 06/07/2016   10:17 AM 06/02/2016    5:24 AM  BMP  Glucose 70 - 99 mg/dL 100  105  113   BUN 8 - 23 mg/dL 7  <5  5   Creatinine 0.44 - 1.00 mg/dL 0.71  0.58  0.54   Sodium 135 - 145 mmol/L 143  141  140   Potassium 3.5 - 5.1 mmol/L 3.4  3.3  3.8   Chloride 98 - 111 mmol/L 108  106  108   CO2 22 - 32 mmol/L _0 Calcium 8.9 - 10.3 mg/dL 9.5  9.6  9.6       Latest Ref Rng & Units 02/27/2022    2:33 PM 06/07/2016   10:17 AM 06/02/2016    5:24 AM  Hepatic Function  Total Protein 6.5 - 8.1 g/dL 5.9  6.2  5.5   Albumin 3.5 - 5.0 g/dL 3.7  3.1  3.1   AST 15 - 41 U/L 23  76  124   ALT 0 - 44 U/L 22  199  155   Alk Phosphatase 38 - 126 U/L 99  252  164   Total Bilirubin 0.3 - 1.2 mg/dL 1.1  0.7  0.8     External labs:    Radiology:    Cardiac Studies:   No results found for this or any previous visit from the past 1095  days.     No results found for this or any previous visit from the past 1095 days.   EKG:   EKG 05/07/2022: Normal sinus rhythm with rate of 69 bpm, normal axis, incomplete right bundle branch block.  Poor R wave progression, probably normal variant.  EKG within normal limits.    Assessment     ICD-10-CM   1. Palpitations  R00.2 EKG 12-Lead    2.  Transient global amnesia  G45.4        Orders Placed This Encounter  Procedures   EKG 12-Lead    No orders of the defined types were placed in this encounter.   Medications Discontinued During This Encounter  Medication Reason   phenazopyridine (PYRIDIUM) 200 MG tablet    ondansetron (ZOFRAN ODT) 4 MG disintegrating tablet    nitrofurantoin, macrocrystal-monohydrate, (MACROBID) 100 MG capsule      Recommendations:   AREEBAH MEINDERS  is a 71 y.o. with history of GERD and UTIs that was referred to Cardiology by Neuro after episode of transient amnesia. She reports that on the morning of July 1 she woke up to make coffee with her husband and decided that they wanted to go back to bed. When laying back in bed she was confused and asked how she ended up back in the bed. She was disoriented to place and time. She returned to her usual state of health and mental status at approximately 2pm that afternoon. In the ED her workup including head CT and MRI, blood work, and urine studies did not reveal any acute processes including stroke. EEG was negative for seizure activity.  She does not have history of diabetes, cardiovascular disease, hypertension, dyslipidemia, or tobacco use. ECG within normal limites. Echo within normal limits without evidence of aortic atherosclerotic disease. Her physical exam with normal without any deficits. Cardiology exam is normal without presence of murmur. Microvascular changes on MRI brain most likely age related.  We discussed having lipid panel checked at next PCP appointment. She is otherwise clear from cardiology standpoint. Encouraged her to follow-up if needed or any new changes occur.    Ernst Spell, AGNP-C 05/07/2022, 12:43 PM Office: [phone]

## 2022-05-14 DIAGNOSIS — M25552 Pain in left hip: Secondary | ICD-10-CM | POA: Diagnosis not present

## 2022-05-14 DIAGNOSIS — M25542 Pain in joints of left hand: Secondary | ICD-10-CM | POA: Diagnosis not present

## 2022-05-14 DIAGNOSIS — M25551 Pain in right hip: Secondary | ICD-10-CM | POA: Diagnosis not present

## 2022-05-14 DIAGNOSIS — M25541 Pain in joints of right hand: Secondary | ICD-10-CM | POA: Diagnosis not present

## 2022-05-24 ENCOUNTER — Ambulatory Visit
Admission: EM | Admit: 2022-05-24 | Discharge: 2022-05-24 | Disposition: A | Discharge status: Home / Self Care | Payer: PPO

## 2022-05-24 DIAGNOSIS — S70261A Insect bite (nonvenomous), right hip, initial encounter: Secondary | ICD-10-CM

## 2022-05-24 DIAGNOSIS — W57XXXA Bitten or stung by nonvenomous insect and other nonvenomous arthropods, initial encounter: Secondary | ICD-10-CM

## 2022-05-24 MED ORDER — DOXYCYCLINE HYCLATE 100 MG PO CAPS: 200.0000 mg | ORAL_CAPSULE | Freq: Once | ORAL | 0 refills | Status: AC

## 2022-05-24 NOTE — ED Triage Notes (Signed)
Pt. Is stating she was bit by 3 ticks on her right hip. Pt. States she found them Friday morning.

## 2022-05-24 NOTE — ED Provider Notes (Signed)
[facility]    CSN: [id number] Arrival date & time: 05/24/22  8891      History   Chief Complaint Chief Complaint  Patient presents with   Insect Bite    HPI JULIEANA ESHLEMAN is a 71 y.o. female.   HPI  Presents to UC with report of tick bite on her right hip 3 days ago.  States the ticks were attached to her "overnight".  Endorses some redness at the site, but no rash.  Denies fever or other systemic symptoms.  She took Benadryl which she believes helped with rash.  Past Medical History:  Diagnosis Date   Allergic rhinitis    Anxiety    Depression    Diverticulosis    HEMMORHOIDS ON COLONOSCOPY   IBS (irritable bowel syndrome)    DIARRHEA PRONE   OA (osteoarthritis)    HANDS   Skin cancer, basal cell    HX skin cancer per patient   TMJ syndrome    Vitamin D deficiency     Patient Active Problem List   Diagnosis Date Noted   Pain in right foot 03/10/2021   Dupuytren's disease of palm 08/14/2018   Acute pancreatitis 05/31/2016   Pancreatitis 05/31/2016   Nausea and vomiting 05/31/2016   Leukocytosis 05/31/2016   Hyperglycemia 05/31/2016    Past Surgical History:  Procedure Laterality Date   BONE SPURS REMOVAL     TOES   BREAST BIOPSY     RIGHT   BREAST ENHANCEMENT SURGERY     THEN REMOVED   CHOLECYSTECTOMY N/A 06/01/2016   Procedure: LAPAROSCOPIC CHOLECYSTECTOMY WITH INTRAOPERATIVE CHOLANGIOGRAM;  Surgeon: Coralie Keens, MD;  Location: [facility];  Service: General;  Laterality: N/A;   FOOT SURGERY     Right   SHOULDER SURGERY     RIGHT   TUBAL LIGATION     VESICOVAGINAL FISTULA CLOSURE W/ TAH     WITHOUT OOPHS    OB History   No obstetric history on file.      Home Medications    Prior to Admission medications   Medication Sig Start Date End Date Taking? Authorizing Provider  celecoxib (CELEBREX) 200 MG capsule celecoxib 200 mg capsule  TAKE 1 CAPSULE BY MOUTH EVERY DAY    [provider]  cephALEXin (KEFLEX) 500 MG  capsule TAKE 1 CAPSULE BY MOUTH TWICE A DAY FOR 10 DAYS    [provider]  Cholecalciferol (VITAMIN D) 2000 UNITS CAPS Take 5,000 Units by mouth daily.     [provider]  doxycycline (VIBRAMYCIN) 100 MG capsule TAKE 1 CAPSULE BY MOUTH TWICE A DAY FOR 7 DAYS    [provider]  estradiol (ESTRACE) 0.1 MG/GM vaginal cream PLACE 1/2 GRAM USING APPLICATOR VAGINALLY 2 TIMES WEEKLY    [provider]  nitrofurantoin, macrocrystal-monohydrate, (MACROBID) 100 MG capsule Take 1 capsule by mouth 2 (two) times daily.    [provider]  omeprazole (PRILOSEC) 20 MG capsule Take 20 mg by mouth every morning. 03/24/22   [provider]  phenazopyridine (PYRIDIUM) 200 MG tablet TAKE 1 TABLET BY MOUTH THREE TIMES A DAY AS NEEDED FOR PAIN    [provider]  Polyvinyl Alcohol-Povidone (REFRESH OP) Apply 1-2 drops to eye daily as needed (dry eyes).    [provider]    Family History Family History  Problem Relation Age of Onset   Hypertension Mother    Cancer - Other Mother    Lung cancer Father    Cancer -  Other Brother    CVA Brother    Heart attack Brother    AAA (abdominal aortic aneurysm) Sister    Depression Sister     Social History Social History   Tobacco Use   Smoking status: Never   Smokeless tobacco: Never  Vaping Use   Vaping Use: Never used  Substance Use Topics   Alcohol use: No   Drug use: No     Allergies   Ketorolac, Morphine, Oxycodone-acetaminophen, Pentazocine, Cefuroxime, Ketorolac tromethamine, Morphine and related, Nitrofurantoin, Percocet [oxycodone-acetaminophen], and Zoloft [sertraline hcl]   Review of Systems Review of Systems   Physical Exam Triage Vital Signs ED Triage Vitals [05/24/22 0820]  Enc Vitals Group     BP (!) 145/75     Pulse Rate 66     Resp 15     Temp 97.8 F (36.6 C)     Temp src      SpO2 94 %     Weight      Height      Head Circumference      Peak Flow       Pain Score 0     Pain Loc      Pain Edu?      Excl. in [place]?    No data found.  Updated Vital Signs BP (!) 145/75   Pulse 66   Temp 97.8 F (36.6 C)   Resp 15   SpO2 94%   Visual Acuity Right Eye Distance:   Left Eye Distance:   Bilateral Distance:    Right Eye Near:   Left Eye Near:    Bilateral Near:     Physical Exam Vitals reviewed.  Constitutional:      Appearance: Normal appearance.  Musculoskeletal:       Legs:  Skin:    General: Skin is warm and dry.  Neurological:     General: No focal deficit present.     Mental Status: She is alert and oriented to person, place, and time.  Psychiatric:        Mood and Affect: Mood normal.        Behavior: Behavior normal.      UC Treatments / Results  Labs (all labs ordered are listed, but only abnormal results are displayed) Labs Reviewed - No data to display  EKG   Radiology No results found.  Procedures Procedures (including critical care time)  Medications Ordered in UC Medications - No data to display  Initial Impression / Assessment and Plan / UC Course  I have reviewed the triage vital signs and the nursing notes.  Pertinent labs & imaging results that were available during my care of the patient were reviewed by me and considered in my medical decision making (see chart for details).   Tick bite without evidence of tickborne illness.  We will treat with prophylactic dose of doxycycline 200 mg once.  Discussed signs and symptoms of tickborne illness to look for.  Patient will reach out to PCP or return to urgent care if additional symptoms develop which would necessitate longer course of treatment.   Final Clinical Impressions(s) / UC Diagnoses   Final diagnoses:  None   Discharge Instructions   None    ED Prescriptions   None    PDMP not reviewed this encounter.   Rose Phi, Newborn 05/24/22 [phone]

## 2022-05-24 NOTE — Discharge Instructions (Addendum)
Follow-up here or with your primary care provider if you develop symptoms of tickborne illness.

## 2022-05-25 DIAGNOSIS — H25043 Posterior subcapsular polar age-related cataract, bilateral: Secondary | ICD-10-CM | POA: Diagnosis not present

## 2022-05-25 DIAGNOSIS — H25013 Cortical age-related cataract, bilateral: Secondary | ICD-10-CM | POA: Diagnosis not present

## 2022-05-25 DIAGNOSIS — H2513 Age-related nuclear cataract, bilateral: Secondary | ICD-10-CM | POA: Diagnosis not present

## 2022-05-25 DIAGNOSIS — H18413 Arcus senilis, bilateral: Secondary | ICD-10-CM | POA: Diagnosis not present

## 2022-05-25 DIAGNOSIS — H2512 Age-related nuclear cataract, left eye: Secondary | ICD-10-CM | POA: Diagnosis not present

## 2022-06-01 NOTE — Progress Notes (Signed)
Echocardiogram is normal, thank you

## 2022-06-08 ENCOUNTER — Other Ambulatory Visit: Payer: Self-pay | Admitting: Physician Assistant

## 2022-06-08 DIAGNOSIS — R002 Palpitations: Secondary | ICD-10-CM

## 2022-08-09 ENCOUNTER — Ambulatory Visit (INDEPENDENT_AMBULATORY_CARE_PROVIDER_SITE_OTHER): Payer: PPO | Admitting: Physician Assistant

## 2022-08-09 VITALS — BP 129/76 | HR 78 | Resp 20 | Wt 173.0 lb

## 2022-08-09 DIAGNOSIS — R413 Other amnesia: Secondary | ICD-10-CM

## 2022-08-09 DIAGNOSIS — R404 Transient alteration of awareness: Secondary | ICD-10-CM

## 2022-08-09 NOTE — Patient Instructions (Signed)
Follow up as needed  Take good care of the cardiovascular risk factors

## 2022-08-09 NOTE — Progress Notes (Signed)
Assessment/Plan:   Transient Alteration of Awareness, unclear etiology Memory Impairment  [name] is a very pleasant 71 y.o. RH female with a history of anxiety, depression, diverticulosis, IBS, intermittent diarrhea, osteoarthritis, TMJ, vitamin D deficiency as well as a history of frequent UTIs (last one on 4/23 for Klebsiella treated with Keflex and a recurrent one and February 05, 2022) presenting today in follow-up for evaluation of recent transient alteration of awareness occurring in July 2023. Personally reviewed EEG September 2023 was negative for seizures and MRI of the brain July 2023 was negative for acute findings, only remarkable for chronic mild to moderate microvascular ischemic changes without stroke.     Recommendations:   Recommended to follow-up in 1 year, however patient politely declines unless symptoms worsened.  She agreed to follow-up as needed. Recommend good control of cardiovascular risk factors Recommend to control mood as per PCP    Subjective:   This patient is accompanied in the office by her husband who supplements the history. Previous records as well as any outside records available were reviewed prior to todays visit.      Any changes in memory since last visit?  She reports that overall, her memory is stable, she is able to remember conversations, and names of people as long as she knows them.  She denies any worsening short-term memory.  Her long-term memory is good.  She denies any new episodes of transient alteration of awareness.  repeats oneself?  Denies Disoriented when walking into a room?  Patient denies   Leaving objects in unusual places?  Patient denies   Ambulates  with difficulty?   Patient denies   Recent falls?  Patient denies   Any head injuries?  Patient denies   History of seizures?   Patient denies   Wandering behavior?  Patient denies   Patient drives?  She denies any issues with driving. Any mood changes since last  visit?  Patient denies   Any worsening depression?:  Patient denies   Hallucinations?  Patient denies   Paranoia?  Patient denies   Patient reports that sleeps well without vivid dreams, REM behavior or sleepwalking   History of sleep apnea?  Patient denies   Any hygiene concerns?  Patient denies   Independent of bathing and dressing?  Endorsed  Does the patient needs help with medications?  Patient in charge  Who is in charge of the finances?  Patient is in charge    Any changes in appetite?  Her appetite is good.   Patient have trouble swallowing? Patient denies   Does the patient cook?  Patient denies   Any kitchen accidents such as leaving the stove on? Patient denies   Any headaches?  Patient denies   Double vision? Patient denies   Any focal numbness or tingling?  Patient denies   Chronic back pain Patient denies   Unilateral weakness?  Patient denies   Any tremors?  Patient denies   Any history of anosmia?  Patient denies   Any incontinence of urine?  Patient denies   Any bowel dysfunction?   Patient denies      Patient lives with her husband.  They are getting ready to go to [place] for several months.     Initial evaluation 03/19/2022 Patient reports that the day prior to her symptoms, she was working in the yard, and it was very hot.  "I am not sure I had a heat stroke ".  She reports not drinking enough  water.  When she went to sleep that night, she slept well.  She wakes up in the morning, but since then, does not remember any of the events.  Apparently, the patient had gone to the bathroom, and was not sure why she walked there.  She does not remember drinking coffee with her husband.  On conversations, she cannot remember any of them.  Her husband states that he talked about her nephew having had surgery, her has not plans for surgery, etc.  She states that she does not recall being brought by EMS to the hospital, she woke up around 2:20 PM at the ED.  At the time, her family  had informed of all the events, but she does not remember.  After a while, as the family began to explain what happened, "everything came back to me ".  She did find herself asking the same questions.  She reports never having an episode like this.  They did an MRI of the brain, which was negative for acute findings.   Any history of seizures seizure:Never  Witnessed?  Husband and son  Tremor:  no  Voice: no  Hallucinations:              visual distortions no              Auditory hallucinations? No   Taste of blood or metallic taste? No  Nausea and Vomiting? No  Lightheadedness/ Dizziness? No              Syncope? No    Diplopia or other visual changes? No  History of encephalitis or meningitis? No  History of Headaches? No  Head Trauma/Injuries? Age 23 had a trauma to the head w LOC L parietoccipital area  History of anxiety  Loss of smell:  no  Loss of taste :  no  Urinary or Bowel Incontinence :no  Difficulty Swallowing ? No  Trouble with ADL's? No              Trouble buttoning clothing?no   Mood Changes? Depression. Overwhelmed by the trees that she is use to see from her window being turned down about 6 weeks ago. Memory changes: denies  Sleep:  How many hours? 4 to 6 hours               Rested upon waking up? Yes  ETOH? No  Tobacco? No  Recreational Drugs? No    Caffeine? Postural symptoms:               Falls?  No    Patient  never had a similar episode. Denies any history of TIA. She has a history of BPV not recently. Denies headaches, dysarthria or dysphagia. Denies any chest pain, or shortness of breath. She has palps during that episode ( up to 120)  Denies any fever or chills, but she did have an episode of sweating, "I feel my body heats up lasting a few seconds and resolved ".  No new meds or hormonal supplements. Does not take a regular ASA a day. Denies any recent long distance trips or recent surgeries. No sick contacts but she did have a tick felt to be due to  a deer tick, and she was treated with doxycycline several weeks ago.  Patient is compliant with his medications.  Patient is very active, ride bicycle, exercising daily.  she not a diabetic. family history of stroke in her brother        Past  Medical History:  Diagnosis Date   Allergic rhinitis    Anxiety    Depression    Diverticulosis    HEMMORHOIDS ON COLONOSCOPY   IBS (irritable bowel syndrome)    DIARRHEA PRONE   OA (osteoarthritis)    HANDS   Skin cancer, basal cell    HX skin cancer per patient   TMJ syndrome    Vitamin D deficiency      Past Surgical History:  Procedure Laterality Date   BONE SPURS REMOVAL     TOES   BREAST BIOPSY     RIGHT   BREAST ENHANCEMENT SURGERY     THEN REMOVED   CHOLECYSTECTOMY N/A 06/01/2016   Procedure: LAPAROSCOPIC CHOLECYSTECTOMY WITH INTRAOPERATIVE CHOLANGIOGRAM;  Surgeon: Coralie Keens, MD;  Location: [facility];  Service: General;  Laterality: N/A;   FOOT SURGERY     Right   SHOULDER SURGERY     RIGHT   TUBAL LIGATION     VESICOVAGINAL FISTULA CLOSURE W/ TAH     WITHOUT OOPHS     PREVIOUS MEDICATIONS:   CURRENT MEDICATIONS:  Outpatient Encounter Medications as of 08/09/2022  Medication Sig   celecoxib (CELEBREX) 200 MG capsule celecoxib 200 mg capsule  TAKE 1 CAPSULE BY MOUTH EVERY DAY   cephALEXin (KEFLEX) 500 MG capsule TAKE 1 CAPSULE BY MOUTH TWICE A DAY FOR 10 DAYS   Cholecalciferol (VITAMIN D) 2000 UNITS CAPS Take 5,000 Units by mouth daily.    estradiol (ESTRACE) 0.1 MG/GM vaginal cream PLACE 1/2 GRAM USING APPLICATOR VAGINALLY 2 TIMES WEEKLY   nitrofurantoin, macrocrystal-monohydrate, (MACROBID) 100 MG capsule Take 1 capsule by mouth 2 (two) times daily.   omeprazole (PRILOSEC) 20 MG capsule Take 20 mg by mouth every morning.   phenazopyridine (PYRIDIUM) 200 MG tablet TAKE 1 TABLET BY MOUTH THREE TIMES A DAY AS NEEDED FOR PAIN   Polyvinyl Alcohol-Povidone (REFRESH OP) Apply 1-2 drops to eye daily as needed (dry eyes).    No facility-administered encounter medications on file as of 08/09/2022.     Objective:     PHYSICAL EXAMINATION:    VITALS:   Vitals:   08/09/22 0910  BP: 129/76  Pulse: 78  Resp: 20  SpO2: 97%  Weight: 173 lb (78.5 kg)    GEN:  The patient appears stated age and is in NAD. HEENT:  Normocephalic, atraumatic.   Neurological examination:  General: NAD, well-groomed, appears stated age. Orientation: The patient is alert. Oriented to person, place and date Cranial nerves: There is good facial symmetry.The speech is fluent and clear. No aphasia or dysarthria. Fund of knowledge is appropriate. Recent memory impaired and remote memory is normal.  Attention and concentration are normal.  Able to name objects and repeat phrases.  Hearing is intact to conversational tone.    Sensation: Sensation is intact to light touch throughout Motor: Strength is at least antigravity x4. Tremors: none  DTR's 2/4 in UE/LE      03/24/2022    8:00 AM  Montreal Cognitive Assessment   Visuospatial/ Executive (0/5) 4  Naming (0/3) 3  Attention: Read list of digits (0/2) 2  Attention: Read list of letters (0/1) 1  Attention: Serial 7 subtraction starting at 100 (0/3) 2  Language: Repeat phrase (0/2) 1  Language : Fluency (0/1) 0  Abstraction (0/2) 1  Delayed Recall (0/5) 4  Orientation (0/6) 6  Total 24  Adjusted Score (based on education) 24        No data to display  Movement examination: Tone: There is normal tone in the UE/LE Abnormal movements:  no tremor.  No myoclonus.  No asterixis.   Coordination:  There is no decremation with RAM's. Normal finger to nose  Gait and Station: The patient has no difficulty arising out of a deep-seated chair without the use of the hands. The patient's stride length is good.  Gait is cautious and narrow.   Thank you for allowing [place] the opportunity to participate in the care of this nice patient. Please do not hesitate to contact us  for any questions or concerns.   Total time spent on today's visit was 20 minutes dedicated to this patient today, preparing to see patient, examining the patient, ordering tests and/or medications and counseling the patient, documenting clinical information in the EHR or other health record, independently interpreting results and communicating results to the patient/family, discussing treatment and goals, answering patient's questions and coordinating care.  Cc:  Harlan Stains, MD  Sharene Butters 08/09/2022 12:57 PM

## 2022-08-09 NOTE — Addendum Note (Signed)
Addended by: Sharene Butters E on: 08/09/2022 03:59 PM   Modules accepted: Level of Service

## 2022-08-26 ENCOUNTER — Ambulatory Visit: Payer: PPO | Admitting: Physician Assistant

## 2022-11-13 ENCOUNTER — Other Ambulatory Visit: Payer: Self-pay

## 2022-11-13 ENCOUNTER — Encounter (HOSPITAL_COMMUNITY): Payer: Self-pay | Admitting: Emergency Medicine

## 2022-11-13 ENCOUNTER — Emergency Department (HOSPITAL_COMMUNITY)
Admission: EM | Admit: 2022-11-13 | Discharge: 2022-11-13 | Disposition: A | Discharge status: Home / Self Care | Payer: PPO | Attending: Emergency Medicine | Admitting: Emergency Medicine

## 2022-11-13 DIAGNOSIS — R112 Nausea with vomiting, unspecified: Secondary | ICD-10-CM | POA: Diagnosis not present

## 2022-11-13 DIAGNOSIS — R197 Diarrhea, unspecified: Secondary | ICD-10-CM | POA: Insufficient documentation

## 2022-11-13 DIAGNOSIS — N3000 Acute cystitis without hematuria: Secondary | ICD-10-CM | POA: Diagnosis not present

## 2022-11-13 DIAGNOSIS — Z85828 Personal history of other malignant neoplasm of skin: Secondary | ICD-10-CM | POA: Insufficient documentation

## 2022-11-13 DIAGNOSIS — N390 Urinary tract infection, site not specified: Secondary | ICD-10-CM | POA: Diagnosis not present

## 2022-11-13 LAB — URINALYSIS, ROUTINE W REFLEX MICROSCOPIC
Bacteria, UA: NONE SEEN
Bilirubin Urine: NEGATIVE
Glucose, UA: NEGATIVE mg/dL
Ketones, ur: NEGATIVE mg/dL
Nitrite: NEGATIVE
Protein, ur: NEGATIVE mg/dL
Specific Gravity, Urine: 1.019 (ref 1.005–1.030)
pH: 5 (ref 5.0–8.0)

## 2022-11-13 LAB — COMPREHENSIVE METABOLIC PANEL
ALT: 18 U/L (ref 0–44)
AST: 22 U/L (ref 15–41)
Albumin: 4 g/dL (ref 3.5–5.0)
Alkaline Phosphatase: 104 U/L (ref 38–126)
Anion gap: 14 (ref 5–15)
BUN: 14 mg/dL (ref 8–23)
CO2: 19 mmol/L — ABNORMAL LOW (ref 22–32)
Calcium: 9.5 mg/dL (ref 8.9–10.3)
Chloride: 107 mmol/L (ref 98–111)
Creatinine, Ser: 0.78 mg/dL (ref 0.44–1.00)
GFR, Estimated: >60 mL/min (ref >60)
Glucose, Bld: 153 mg/dL — ABNORMAL HIGH (ref 70–99)
Potassium: 3.5 mmol/L (ref 3.5–5.1)
Sodium: 140 mmol/L (ref 135–145)
Total Bilirubin: 0.9 mg/dL (ref 0.3–1.2)
Total Protein: 6.5 g/dL (ref 6.5–8.1)

## 2022-11-13 LAB — CBC
HCT: 44 % (ref 36.0–46.0)
Hemoglobin: 14.8 g/dL (ref 12.0–15.0)
MCH: 29.2 pg (ref 26.0–34.0)
MCHC: 33.6 g/dL (ref 30.0–36.0)
MCV: 87 fL (ref 80.0–100.0)
Platelets: 300 10*3/uL (ref 150–400)
RBC: 5.06 MIL/uL (ref 3.87–5.11)
RDW: 13.2 % (ref 11.5–15.5)
WBC: 14.8 10*3/uL — ABNORMAL HIGH (ref 4.0–10.5)
nRBC: 0 % (ref 0.0–0.2)

## 2022-11-13 LAB — LIPASE, BLOOD: Lipase: 27 U/L (ref 11–51)

## 2022-11-13 MED ORDER — SULFAMETHOXAZOLE-TRIMETHOPRIM 800-160 MG PO TABS: 1.0000 | ORAL_TABLET | Freq: Two times a day (BID) | ORAL | 0 refills | Status: AC

## 2022-11-13 MED ORDER — SODIUM CHLORIDE 0.9 % IV BOLUS
500.0000 mL | Freq: Once | INTRAVENOUS | Status: AC
  Administered 2022-11-13: 500 mL via INTRAVENOUS

## 2022-11-13 MED ORDER — ONDANSETRON HCL 4 MG/2ML IJ SOLN
4.0000 mg | Freq: Once | INTRAMUSCULAR | Status: AC
  Administered 2022-11-13: 4 mg via INTRAVENOUS
  Filled 2022-11-13: qty 2

## 2022-11-13 MED ORDER — ONDANSETRON 8 MG PO TBDP: ORAL_TABLET | ORAL | 0 refills | Status: DC

## 2022-11-13 NOTE — ED Provider Notes (Signed)
[facility] Provider Note   CSN: [id number] Arrival date & time: 11/13/22  0422     History  Chief Complaint  Patient presents with   Emesis    [name] is a 72 y.o. female.  The history is provided by the patient.  Emesis Severity:  Moderate Timing:  Intermittent Quality:  Stomach contents Progression:  Unchanged Chronicity:  New Recent urination:  Normal Context: not post-tussive   Relieved by:  Nothing Worsened by:  Nothing Ineffective treatments:  None tried Associated symptoms: diarrhea   Associated symptoms: no abdominal pain and no fever   Risk factors: no alcohol use   Patient with IBS presents with sudden onset nausea vomiting and diarrhea.      Past Medical History:  Diagnosis Date   Allergic rhinitis    Anxiety    Depression    Diverticulosis    HEMMORHOIDS ON COLONOSCOPY   IBS (irritable bowel syndrome)    DIARRHEA PRONE   OA (osteoarthritis)    HANDS   Skin cancer, basal cell    HX skin cancer per patient   TMJ syndrome    Vitamin D deficiency      Home Medications Prior to Admission medications   Medication Sig Start Date End Date Taking? Authorizing Provider  celecoxib (CELEBREX) 200 MG capsule celecoxib 200 mg capsule  TAKE 1 CAPSULE BY MOUTH EVERY DAY    [provider]  cephALEXin (KEFLEX) 500 MG capsule TAKE 1 CAPSULE BY MOUTH TWICE A DAY FOR 10 DAYS    [provider]  Cholecalciferol (VITAMIN D) 2000 UNITS CAPS Take 5,000 Units by mouth daily.     [provider]  estradiol (ESTRACE) 0.1 MG/GM vaginal cream PLACE 1/2 GRAM USING APPLICATOR VAGINALLY 2 TIMES WEEKLY    [provider]  nitrofurantoin, macrocrystal-monohydrate, (MACROBID) 100 MG capsule Take 1 capsule by mouth 2 (two) times daily.    [provider]  omeprazole (PRILOSEC) 20 MG capsule Take 20 mg by mouth every morning. 03/24/22   [provider]  phenazopyridine  (PYRIDIUM) 200 MG tablet TAKE 1 TABLET BY MOUTH THREE TIMES A DAY AS NEEDED FOR PAIN    [provider]  Polyvinyl Alcohol-Povidone (REFRESH OP) Apply 1-2 drops to eye daily as needed (dry eyes).    [provider]      Allergies    Ketorolac, Morphine, Oxycodone-acetaminophen, Pentazocine, Cefuroxime, Ketorolac tromethamine, Morphine and related, Nitrofurantoin, Percocet [oxycodone-acetaminophen], and Zoloft [sertraline hcl]    Review of Systems   Review of Systems  Constitutional:  Negative for fever.  HENT:  Negative for facial swelling.   Eyes:  Negative for redness.  Respiratory:  Negative for wheezing and stridor.   Gastrointestinal:  Positive for diarrhea and vomiting. Negative for abdominal pain.  All other systems reviewed and are negative.   Physical Exam Updated Vital Signs BP 137/77 (BP Location: Right Arm)   Pulse (!) 116   Temp 99 F (37.2 C) (Oral)   Resp 17   Ht 5\' 3"  (1.6 m)   Wt 77.1 kg   SpO2 98%   BMI 30.11 kg/m  Physical Exam Vitals and nursing note reviewed.  Constitutional:      General: She is not in acute distress.    Appearance: Normal appearance. She is well-developed.  HENT:     Head: Normocephalic and atraumatic.     Nose: Nose normal.  Eyes:     Pupils: Pupils are equal, round, and  reactive to light.  Cardiovascular:     Rate and Rhythm: Normal rate and regular rhythm.     Pulses: Normal pulses.     Heart sounds: Normal heart sounds.  Pulmonary:     Effort: Pulmonary effort is normal. No respiratory distress.     Breath sounds: Normal breath sounds.  Abdominal:     General: Bowel sounds are normal. There is no distension.     Palpations: Abdomen is soft.     Tenderness: There is no abdominal tenderness. There is no guarding or rebound.  Genitourinary:    Vagina: No vaginal discharge.  Musculoskeletal:        General: Normal range of motion.     Cervical back: Normal range of motion and neck supple.  Skin:     General: Skin is warm and dry.     Capillary Refill: Capillary refill takes less than 2 seconds.     Findings: No erythema or rash.  Neurological:     General: No focal deficit present.     Mental Status: She is alert and oriented to person, place, and time.     Deep Tendon Reflexes: Reflexes normal.  Psychiatric:        Mood and Affect: Mood normal.     ED Results / Procedures / Treatments   Labs (all labs ordered are listed, but only abnormal results are displayed) Results for orders placed or performed during the hospital encounter of 11/13/22  Lipase, blood  Result Value Ref Range   Lipase 27 11 - 51 U/L  Comprehensive metabolic panel  Result Value Ref Range   Sodium 140 135 - 145 mmol/L   Potassium 3.5 3.5 - 5.1 mmol/L   Chloride 107 98 - 111 mmol/L   CO2 19 (L) 22 - 32 mmol/L   Glucose, Bld 153 (H) 70 - 99 mg/dL   BUN 14 8 - 23 mg/dL   Creatinine, Ser 0.78 0.44 - 1.00 mg/dL   Calcium 9.5 8.9 - 10.3 mg/dL   Total Protein 6.5 6.5 - 8.1 g/dL   Albumin 4.0 3.5 - 5.0 g/dL   AST 22 15 - 41 U/L   ALT 18 0 - 44 U/L   Alkaline Phosphatase 104 38 - 126 U/L   Total Bilirubin 0.9 0.3 - 1.2 mg/dL   GFR, Estimated >60 >60 mL/min   Anion gap 14 5 - 15  CBC  Result Value Ref Range   WBC 14.8 (H) 4.0 - 10.5 K/uL   RBC 5.06 3.87 - 5.11 MIL/uL   Hemoglobin 14.8 12.0 - 15.0 g/dL   HCT 44.0 36.0 - 46.0 %   MCV 87.0 80.0 - 100.0 fL   MCH 29.2 26.0 - 34.0 pg   MCHC 33.6 30.0 - 36.0 g/dL   RDW 13.2 11.5 - 15.5 %   Platelets 300 150 - 400 K/uL   nRBC 0.0 0.0 - 0.2 %  Urinalysis, Routine w reflex microscopic -Urine, Clean Catch  Result Value Ref Range   Color, Urine YELLOW YELLOW   APPearance HAZY (A) CLEAR   Specific Gravity, Urine 1.019 1.005 - 1.030   pH 5.0 5.0 - 8.0   Glucose, UA NEGATIVE NEGATIVE mg/dL   Hgb urine dipstick MODERATE (A) NEGATIVE   Bilirubin Urine NEGATIVE NEGATIVE   Ketones, ur NEGATIVE NEGATIVE mg/dL   Protein, ur NEGATIVE NEGATIVE mg/dL   Nitrite  NEGATIVE NEGATIVE   Leukocytes,Ua MODERATE (A) NEGATIVE   RBC / HPF 0-5 0 - 5 RBC/hpf   WBC,  UA 11-20 0 - 5 WBC/hpf   Bacteria, UA NONE SEEN NONE SEEN   Squamous Epithelial / HPF 6-10 0 - 5 /HPF   Mucus PRESENT    No results found.   Radiology No results found.  Procedures Procedures    Medications Ordered in ED Medications  sodium chloride 0.9 % bolus 500 mL (500 mLs Intravenous New Bag/Given 11/13/22 0620)  ondansetron [facility]) injection 4 mg (4 mg Intravenous Given 11/13/22 0620)    ED Course/ Medical Decision Making/ A&P                             Medical Decision Making Sudden onset nausea vomiting and diarrhea   Amount and/or Complexity of Data Reviewed External Data Reviewed: notes.    Details: Previous notes reviewed  Labs: ordered.    Details: Urine is consistent with UTI, culture sent.  White count slight elevation 14.8, normal hemoglobin 14.8, normal platelet count.  Normal lipase 27, normal sodium  140, normal potassium 3.5, normal creatinine .78, normal lefts.    Risk Prescription drug management. Risk Details: Exam and history are consistent with viral nausea vomiting and diarrhea.  The abdomen is not surgical in nature.  Urine culture was sent.  Will initiated bactrim DS for UTI.  I do not believe patient is dehydrated or septic.  No further emesis.  Stable for discharge.  Strict return     Final Clinical Impression(s) / ED Diagnoses Final diagnoses:  Nausea vomiting and diarrhea   Return for intractable cough, coughing up blood, fevers > 100.4 unrelieved by medication, shortness of breath, intractable vomiting, chest pain, shortness of breath, weakness, numbness, changes in speech, facial asymmetry, abdominal pain, passing out, Inability to tolerate liquids or food, cough, altered mental status or any concerns. No signs of systemic illness or infection. The patient is nontoxic-appearing on exam and vital signs are within normal limits.  I have reviewed  the triage vital signs and the nursing notes. Pertinent labs & imaging results that were available during my care of the patient were reviewed by me and considered in my medical decision making (see chart for details). After history, exam, and medical workup I feel the patient has been appropriately medically screened and is safe for discharge home. Pertinent diagnoses were discussed with the patient. Patient was given return precautions. Rx / DC Orders ED Discharge Orders     None         Allean Montfort, MD 11/13/22 [phone]

## 2022-11-13 NOTE — ED Triage Notes (Signed)
Pt reports 5 episodes of emesis and diarrhea since last night, denies fever

## 2022-11-14 LAB — URINE CULTURE
Culture: <10000 — AB
Special Requests: NORMAL

## 2022-11-23 DIAGNOSIS — H25043 Posterior subcapsular polar age-related cataract, bilateral: Secondary | ICD-10-CM | POA: Diagnosis not present

## 2022-11-23 DIAGNOSIS — H18413 Arcus senilis, bilateral: Secondary | ICD-10-CM | POA: Diagnosis not present

## 2022-11-23 DIAGNOSIS — H2513 Age-related nuclear cataract, bilateral: Secondary | ICD-10-CM | POA: Diagnosis not present

## 2022-11-23 DIAGNOSIS — H25013 Cortical age-related cataract, bilateral: Secondary | ICD-10-CM | POA: Diagnosis not present

## 2022-11-23 DIAGNOSIS — H2512 Age-related nuclear cataract, left eye: Secondary | ICD-10-CM | POA: Diagnosis not present

## 2022-12-29 DIAGNOSIS — H43812 Vitreous degeneration, left eye: Secondary | ICD-10-CM | POA: Diagnosis not present

## 2023-01-14 DIAGNOSIS — M25521 Pain in right elbow: Secondary | ICD-10-CM | POA: Diagnosis not present

## 2023-02-03 DIAGNOSIS — Z791 Long term (current) use of non-steroidal anti-inflammatories (NSAID): Secondary | ICD-10-CM | POA: Diagnosis not present

## 2023-02-09 DIAGNOSIS — H2512 Age-related nuclear cataract, left eye: Secondary | ICD-10-CM | POA: Diagnosis not present

## 2023-02-09 DIAGNOSIS — H2513 Age-related nuclear cataract, bilateral: Secondary | ICD-10-CM | POA: Diagnosis not present

## 2023-02-10 DIAGNOSIS — H25011 Cortical age-related cataract, right eye: Secondary | ICD-10-CM | POA: Diagnosis not present

## 2023-02-10 DIAGNOSIS — H25041 Posterior subcapsular polar age-related cataract, right eye: Secondary | ICD-10-CM | POA: Diagnosis not present

## 2023-02-10 DIAGNOSIS — H2511 Age-related nuclear cataract, right eye: Secondary | ICD-10-CM | POA: Diagnosis not present

## 2023-02-23 DIAGNOSIS — H2511 Age-related nuclear cataract, right eye: Secondary | ICD-10-CM | POA: Diagnosis not present

## 2023-04-20 DIAGNOSIS — H35033 Hypertensive retinopathy, bilateral: Secondary | ICD-10-CM | POA: Diagnosis not present

## 2023-04-29 DIAGNOSIS — M542 Cervicalgia: Secondary | ICD-10-CM | POA: Diagnosis not present

## 2023-04-29 DIAGNOSIS — M1811 Unilateral primary osteoarthritis of first carpometacarpal joint, right hand: Secondary | ICD-10-CM | POA: Diagnosis not present

## 2023-04-29 DIAGNOSIS — M79642 Pain in left hand: Secondary | ICD-10-CM | POA: Diagnosis not present

## 2023-05-04 DIAGNOSIS — H43393 Other vitreous opacities, bilateral: Secondary | ICD-10-CM | POA: Diagnosis not present

## 2023-05-04 DIAGNOSIS — H26493 Other secondary cataract, bilateral: Secondary | ICD-10-CM | POA: Diagnosis not present

## 2023-05-04 DIAGNOSIS — H35361 Drusen (degenerative) of macula, right eye: Secondary | ICD-10-CM | POA: Diagnosis not present

## 2023-05-11 DIAGNOSIS — H35363 Drusen (degenerative) of macula, bilateral: Secondary | ICD-10-CM | POA: Diagnosis not present

## 2023-05-18 DIAGNOSIS — M720 Palmar fascial fibromatosis [Dupuytren]: Secondary | ICD-10-CM | POA: Diagnosis not present

## 2023-05-18 DIAGNOSIS — M1811 Unilateral primary osteoarthritis of first carpometacarpal joint, right hand: Secondary | ICD-10-CM | POA: Diagnosis not present

## 2023-05-28 ENCOUNTER — Inpatient Hospital Stay
Admission: RE | Admit: 2023-05-28 | Discharge: 2023-05-28 | Discharge status: Home / Self Care | Payer: PPO | Source: Ambulatory Visit | Attending: Emergency Medicine | Admitting: Emergency Medicine

## 2023-05-28 VITALS — BP 131/82 | HR 80 | Temp 98.1°F | Resp 16

## 2023-05-28 DIAGNOSIS — N3000 Acute cystitis without hematuria: Secondary | ICD-10-CM | POA: Insufficient documentation

## 2023-05-28 LAB — POCT URINALYSIS DIP (MANUAL ENTRY)
Glucose, UA: 100 mg/dL — AB
Ketones, POC UA: NEGATIVE mg/dL
Nitrite, UA: POSITIVE — AB
Protein Ur, POC: 30 mg/dL — AB
Spec Grav, UA: 1.025 (ref 1.010–1.025)
Urobilinogen, UA: 1 U/dL
pH, UA: 6 (ref 5.0–8.0)

## 2023-05-28 MED ORDER — CEPHALEXIN 500 MG PO CAPS: 500.0000 mg | ORAL_CAPSULE | Freq: Three times a day (TID) | ORAL | 0 refills | Status: AC

## 2023-05-28 MED ORDER — PHENAZOPYRIDINE HCL 200 MG PO TABS: 200.0000 mg | ORAL_TABLET | Freq: Three times a day (TID) | ORAL | 0 refills | Status: DC

## 2023-05-28 NOTE — Discharge Instructions (Addendum)
Your urinalysis shows [name] blood cells and nitrates which are indicative of infection, your urine will be sent to the lab to determine exactly which bacteria is present, if any changes need to be made to your medications you will be notified  Begin use of cephalexin every 8 hours for 5 days  You may use over-the-counter Azo to help minimize your symptoms until antibiotic removes bacteria, this medication will turn your urine orange  Increase your fluid intake through use of water  As always practice good hygiene, wiping front to back and avoidance of scented vaginal products to prevent further irritation  If symptoms continue to persist after use of medication or recur please follow-up with urgent care or your primary doctor as needed

## 2023-05-28 NOTE — ED Triage Notes (Signed)
Patient presents to UC for UTI x 3 days. Reports dysuria, odor, urgency. Took pyridium.

## 2023-05-28 NOTE — ED Provider Notes (Signed)
[facility] Fidd[facility]    CSN: [id number] Arrival date & time: 05/28/23  2956      History   Chief Complaint Chief Complaint  Patient presents with   Dysuria    HPI [name] is a 72 y.o. female.   Patient presents for evaluation of dysuria, urinary frequency and a vaginal odor present for 3 days.  Associated bilateral low back pain.  Has been using Pyridium which has been helpful.  Denies presence of fever, hematuria, abdominal pain or pressure or vaginal symptoms.  Past Medical History:  Diagnosis Date   Allergic rhinitis    Anxiety    Depression    Diverticulosis    HEMMORHOIDS ON COLONOSCOPY   IBS (irritable bowel syndrome)    DIARRHEA PRONE   OA (osteoarthritis)    HANDS   Skin cancer, basal cell    HX skin cancer per patient   TMJ syndrome    Vitamin D deficiency     Patient Active Problem List   Diagnosis Date Noted   Pain in right foot 03/10/2021   Dupuytren's disease of palm 08/14/2018   Acute pancreatitis 05/31/2016   Pancreatitis 05/31/2016   Nausea and vomiting 05/31/2016   Leukocytosis 05/31/2016   Hyperglycemia 05/31/2016    Past Surgical History:  Procedure Laterality Date   BONE SPURS REMOVAL     TOES   BREAST BIOPSY     RIGHT   BREAST ENHANCEMENT SURGERY     THEN REMOVED   CHOLECYSTECTOMY N/A 06/01/2016   Procedure: LAPAROSCOPIC CHOLECYSTECTOMY WITH INTRAOPERATIVE CHOLANGIOGRAM;  Surgeon: Abigail Miyamoto, MD;  Location: [facility] OR;  Service: General;  Laterality: N/A;   FOOT SURGERY     Right   SHOULDER SURGERY     RIGHT   TUBAL LIGATION     VESICOVAGINAL FISTULA CLOSURE W/ TAH     WITHOUT OOPHS    OB History   No obstetric history on file.      Home Medications    Prior to Admission medications   Medication Sig Start Date End Date Taking? Authorizing Provider  celecoxib (CELEBREX) 200 MG capsule celecoxib 200 mg capsule  TAKE 1 CAPSULE BY MOUTH EVERY DAY    [provider]  cephALEXin (KEFLEX) 500 MG capsule  Take 1 capsule (500 mg total) by mouth 3 (three) times daily for 5 days. 05/28/23 06/02/23 Yes Arther Heisler R, NP  Cholecalciferol (VITAMIN D) 2000 UNITS CAPS Take 5,000 Units by mouth daily.     [provider]  estradiol (ESTRACE) 0.1 MG/GM vaginal cream PLACE 1/2 GRAM USING APPLICATOR VAGINALLY 2 TIMES WEEKLY    [provider]  nitrofurantoin, macrocrystal-monohydrate, (MACROBID) 100 MG capsule Take 1 capsule by mouth 2 (two) times daily.    [provider]  omeprazole (PRILOSEC) 20 MG capsule Take 20 mg by mouth every morning. 03/24/22   [provider]  ondansetron (ZOFRAN-ODT) 8 MG disintegrating tablet 8mg  ODT q8 hours prn nausea 11/13/22   Palumbo, April, MD  phenazopyridine (PYRIDIUM) 200 MG tablet Take 1 tablet (200 mg total) by mouth 3 (three) times daily. 05/28/23  Yes Shravan Salahuddin, Elita Boone, NP  Polyvinyl Alcohol-Povidone (REFRESH OP) Apply 1-2 drops to eye daily as needed (dry eyes).    [provider]    Family History Family History  Problem Relation Age of Onset   Hypertension Mother    Cancer - Other Mother    Lung cancer Father    Cancer - Other Brother    CVA Brother  Heart attack Brother    AAA (abdominal aortic aneurysm) Sister    Depression Sister     Social History Social History   Tobacco Use   Smoking status: Never   Smokeless tobacco: Never  Vaping Use   Vaping status: Never Used  Substance Use Topics   Alcohol use: No   Drug use: No     Allergies   Ketorolac, Morphine, Oxycodone-acetaminophen, Pentazocine, Cefuroxime, Ketorolac tromethamine, Morphine and codeine, Nitrofurantoin, Oxycodone, Percocet [oxycodone-acetaminophen], and Zoloft [sertraline hcl]   Review of Systems Review of Systems   Physical Exam Triage Vital Signs ED Triage Vitals  Encounter Vitals Group     BP 05/28/23 0934 131/82     Systolic BP Percentile --      Diastolic BP Percentile --      Pulse Rate 05/28/23 0934 80     Resp  05/28/23 0934 16     Temp 05/28/23 0934 98.1 F (36.7 C)     Temp Source 05/28/23 0934 Temporal     SpO2 05/28/23 0934 95 %     Weight --      Height --      Head Circumference --      Peak Flow --      Pain Score 05/28/23 0927 0     Pain Loc --      Pain Education --      Exclude from Growth Chart --    No data found.  Updated Vital Signs BP 131/82 (BP Location: Left Arm)   Pulse 80   Temp 98.1 F (36.7 C) (Temporal)   Resp 16   SpO2 95%   Visual Acuity Right Eye Distance:   Left Eye Distance:   Bilateral Distance:    Right Eye Near:   Left Eye Near:    Bilateral Near:     Physical Exam Constitutional:      Appearance: Normal appearance.  Eyes:     Extraocular Movements: Extraocular movements intact.  Pulmonary:     Effort: Pulmonary effort is normal.  Abdominal:     General: Abdomen is flat. Bowel sounds are normal. There is no distension.     Palpations: Abdomen is soft.     Tenderness: There is no abdominal tenderness. There is no right CVA tenderness, left CVA tenderness or guarding.  Neurological:     Mental Status: She is alert and oriented to person, place, and time.      UC Treatments / Results  Labs (all labs ordered are listed, but only abnormal results are displayed) Labs Reviewed  POCT URINALYSIS DIP (MANUAL ENTRY) - Abnormal; Notable for the following components:      Result Value   Glucose, UA =100 (*)    Bilirubin, UA moderate (*)    Blood, UA small (*)    Protein Ur, POC =30 (*)    Nitrite, UA Positive (*)    Leukocytes, UA Large (3+) (*)    All other components within normal limits  URINE CULTURE    EKG   Radiology No results found.  Procedures Procedures (including critical care time)  Medications Ordered in UC Medications - No data to display  Initial Impression / Assessment and Plan / UC Course  I have reviewed the triage vital signs and the nursing notes.  Pertinent labs & imaging results that were available during  my care of the patient were reviewed by me and considered in my medical decision making (see chart for details).  Acute cystitis without hematuria  Urinalysis showing leukocytes and nitrates, sent for culture, discussed with patient, prescribed cephalexin and Pyridium as it has been helpful, discussed administration, advised supportive care through use of water and good hygiene measures, advised follow-up if symptoms continue to persist worsen or recur Final Clinical Impressions(s) / UC Diagnoses   Final diagnoses:  Acute cystitis without hematuria     Discharge Instructions      Your urinalysis shows Tammra Pressman blood cells and nitrates which are indicative of infection, your urine will be sent to the lab to determine exactly which bacteria is present, if any changes need to be made to your medications you will be notified  Begin use of cephalexin every 8 hours for 5 days  You may use over-the-counter Azo to help minimize your symptoms until antibiotic removes bacteria, this medication will turn your urine orange  Increase your fluid intake through use of water  As always practice good hygiene, wiping front to back and avoidance of scented vaginal products to prevent further irritation  If symptoms continue to persist after use of medication or recur please follow-up with urgent care or your primary doctor as needed    ED Prescriptions     Medication Sig Dispense Auth. Provider   cephALEXin (KEFLEX) 500 MG capsule Take 1 capsule (500 mg total) by mouth 3 (three) times daily for 5 days. 15 capsule Szymon Foiles R, NP   phenazopyridine (PYRIDIUM) 200 MG tablet Take 1 tablet (200 mg total) by mouth 3 (three) times daily. 6 tablet Valinda Hoar, NP      PDMP not reviewed this encounter.   Valinda Hoar, [place] 05/28/23 [phone]

## 2023-05-31 LAB — URINE CULTURE: Culture: >=100000 — AB

## 2023-06-09 DIAGNOSIS — H04123 Dry eye syndrome of bilateral lacrimal glands: Secondary | ICD-10-CM | POA: Diagnosis not present

## 2023-06-09 DIAGNOSIS — H26493 Other secondary cataract, bilateral: Secondary | ICD-10-CM | POA: Diagnosis not present

## 2023-06-09 DIAGNOSIS — Z961 Presence of intraocular lens: Secondary | ICD-10-CM | POA: Diagnosis not present

## 2023-06-09 DIAGNOSIS — H18413 Arcus senilis, bilateral: Secondary | ICD-10-CM | POA: Diagnosis not present

## 2023-06-09 DIAGNOSIS — H26491 Other secondary cataract, right eye: Secondary | ICD-10-CM | POA: Diagnosis not present

## 2023-06-15 DIAGNOSIS — M79642 Pain in left hand: Secondary | ICD-10-CM | POA: Diagnosis not present

## 2023-06-15 DIAGNOSIS — M1811 Unilateral primary osteoarthritis of first carpometacarpal joint, right hand: Secondary | ICD-10-CM | POA: Diagnosis not present

## 2023-06-15 DIAGNOSIS — M720 Palmar fascial fibromatosis [Dupuytren]: Secondary | ICD-10-CM | POA: Diagnosis not present

## 2023-06-15 DIAGNOSIS — M79641 Pain in right hand: Secondary | ICD-10-CM | POA: Diagnosis not present

## 2023-06-15 DIAGNOSIS — M151 Heberden's nodes (with arthropathy): Secondary | ICD-10-CM | POA: Diagnosis not present

## 2023-06-21 DIAGNOSIS — H26492 Other secondary cataract, left eye: Secondary | ICD-10-CM | POA: Diagnosis not present

## 2023-07-13 DIAGNOSIS — S46011A Strain of muscle(s) and tendon(s) of the rotator cuff of right shoulder, initial encounter: Secondary | ICD-10-CM | POA: Diagnosis not present

## 2023-08-04 DIAGNOSIS — R197 Diarrhea, unspecified: Secondary | ICD-10-CM | POA: Diagnosis not present

## 2023-08-04 DIAGNOSIS — R159 Full incontinence of feces: Secondary | ICD-10-CM | POA: Diagnosis not present

## 2023-08-04 DIAGNOSIS — R152 Fecal urgency: Secondary | ICD-10-CM | POA: Diagnosis not present

## 2023-08-09 DIAGNOSIS — R197 Diarrhea, unspecified: Secondary | ICD-10-CM | POA: Diagnosis not present

## 2023-08-09 DIAGNOSIS — K6389 Other specified diseases of intestine: Secondary | ICD-10-CM | POA: Diagnosis not present

## 2023-08-09 DIAGNOSIS — K5730 Diverticulosis of large intestine without perforation or abscess without bleeding: Secondary | ICD-10-CM | POA: Diagnosis not present

## 2023-08-12 DIAGNOSIS — R490 Dysphonia: Secondary | ICD-10-CM | POA: Diagnosis not present

## 2023-08-12 DIAGNOSIS — M5136 Other intervertebral disc degeneration, lumbar region with discogenic back pain only: Secondary | ICD-10-CM | POA: Diagnosis not present

## 2023-08-12 DIAGNOSIS — F419 Anxiety disorder, unspecified: Secondary | ICD-10-CM | POA: Diagnosis not present

## 2023-08-12 DIAGNOSIS — E785 Hyperlipidemia, unspecified: Secondary | ICD-10-CM | POA: Diagnosis not present

## 2023-08-12 DIAGNOSIS — E559 Vitamin D deficiency, unspecified: Secondary | ICD-10-CM | POA: Diagnosis not present

## 2023-08-12 DIAGNOSIS — K580 Irritable bowel syndrome with diarrhea: Secondary | ICD-10-CM | POA: Diagnosis not present

## 2023-08-15 DIAGNOSIS — R490 Dysphonia: Secondary | ICD-10-CM | POA: Diagnosis not present

## 2023-08-15 DIAGNOSIS — J382 Nodules of vocal cords: Secondary | ICD-10-CM | POA: Diagnosis not present

## 2023-08-17 DIAGNOSIS — S46011D Strain of muscle(s) and tendon(s) of the rotator cuff of right shoulder, subsequent encounter: Secondary | ICD-10-CM | POA: Diagnosis not present

## 2023-08-22 DIAGNOSIS — C44319 Basal cell carcinoma of skin of other parts of face: Secondary | ICD-10-CM | POA: Diagnosis not present

## 2023-08-22 DIAGNOSIS — Z85828 Personal history of other malignant neoplasm of skin: Secondary | ICD-10-CM | POA: Diagnosis not present

## 2023-08-22 DIAGNOSIS — L821 Other seborrheic keratosis: Secondary | ICD-10-CM | POA: Diagnosis not present

## 2023-08-22 DIAGNOSIS — Z08 Encounter for follow-up examination after completed treatment for malignant neoplasm: Secondary | ICD-10-CM | POA: Diagnosis not present

## 2023-09-08 DIAGNOSIS — R0600 Dyspnea, unspecified: Secondary | ICD-10-CM | POA: Diagnosis not present

## 2023-09-08 DIAGNOSIS — I82442 Acute embolism and thrombosis of left tibial vein: Secondary | ICD-10-CM | POA: Diagnosis not present

## 2023-09-08 DIAGNOSIS — I82432 Acute embolism and thrombosis of left popliteal vein: Secondary | ICD-10-CM | POA: Diagnosis not present

## 2023-09-08 DIAGNOSIS — I82422 Acute embolism and thrombosis of left iliac vein: Secondary | ICD-10-CM | POA: Diagnosis not present

## 2023-09-08 DIAGNOSIS — I82412 Acute embolism and thrombosis of left femoral vein: Secondary | ICD-10-CM | POA: Diagnosis not present

## 2023-09-09 DIAGNOSIS — I82422 Acute embolism and thrombosis of left iliac vein: Secondary | ICD-10-CM | POA: Diagnosis not present

## 2023-09-09 DIAGNOSIS — Z79891 Long term (current) use of opiate analgesic: Secondary | ICD-10-CM | POA: Diagnosis not present

## 2023-09-09 DIAGNOSIS — Z6831 Body mass index (BMI) 31.0-31.9, adult: Secondary | ICD-10-CM | POA: Diagnosis not present

## 2023-09-09 DIAGNOSIS — Z7901 Long term (current) use of anticoagulants: Secondary | ICD-10-CM | POA: Diagnosis not present

## 2023-09-09 DIAGNOSIS — I82412 Acute embolism and thrombosis of left femoral vein: Secondary | ICD-10-CM | POA: Diagnosis not present

## 2023-09-09 DIAGNOSIS — E669 Obesity, unspecified: Secondary | ICD-10-CM | POA: Diagnosis not present

## 2023-09-09 DIAGNOSIS — R112 Nausea with vomiting, unspecified: Secondary | ICD-10-CM | POA: Diagnosis not present

## 2023-09-09 DIAGNOSIS — I871 Compression of vein: Secondary | ICD-10-CM | POA: Diagnosis not present

## 2023-09-09 DIAGNOSIS — Z79899 Other long term (current) drug therapy: Secondary | ICD-10-CM | POA: Diagnosis not present

## 2023-09-09 DIAGNOSIS — I82432 Acute embolism and thrombosis of left popliteal vein: Secondary | ICD-10-CM | POA: Diagnosis not present

## 2023-09-13 DIAGNOSIS — S8992XA Unspecified injury of left lower leg, initial encounter: Secondary | ICD-10-CM | POA: Diagnosis not present

## 2023-09-13 DIAGNOSIS — Z86718 Personal history of other venous thrombosis and embolism: Secondary | ICD-10-CM | POA: Diagnosis not present

## 2023-09-13 DIAGNOSIS — N390 Urinary tract infection, site not specified: Secondary | ICD-10-CM | POA: Diagnosis not present

## 2023-09-13 DIAGNOSIS — M25562 Pain in left knee: Secondary | ICD-10-CM | POA: Diagnosis not present

## 2023-09-13 DIAGNOSIS — R509 Fever, unspecified: Secondary | ICD-10-CM | POA: Diagnosis not present

## 2023-09-13 DIAGNOSIS — R799 Abnormal finding of blood chemistry, unspecified: Secondary | ICD-10-CM | POA: Diagnosis not present

## 2023-09-14 DIAGNOSIS — S8992XA Unspecified injury of left lower leg, initial encounter: Secondary | ICD-10-CM | POA: Diagnosis not present

## 2023-09-16 DIAGNOSIS — M79605 Pain in left leg: Secondary | ICD-10-CM | POA: Diagnosis not present

## 2023-09-16 DIAGNOSIS — I82432 Acute embolism and thrombosis of left popliteal vein: Secondary | ICD-10-CM | POA: Diagnosis not present

## 2023-09-16 DIAGNOSIS — Z9889 Other specified postprocedural states: Secondary | ICD-10-CM | POA: Diagnosis not present

## 2023-09-16 DIAGNOSIS — Z743 Need for continuous supervision: Secondary | ICD-10-CM | POA: Diagnosis not present

## 2023-09-16 DIAGNOSIS — E66811 Obesity, class 1: Secondary | ICD-10-CM | POA: Diagnosis not present

## 2023-09-16 DIAGNOSIS — Z6830 Body mass index (BMI) 30.0-30.9, adult: Secondary | ICD-10-CM | POA: Diagnosis not present

## 2023-09-16 DIAGNOSIS — Z885 Allergy status to narcotic agent status: Secondary | ICD-10-CM | POA: Diagnosis not present

## 2023-09-16 DIAGNOSIS — D75838 Other thrombocytosis: Secondary | ICD-10-CM | POA: Diagnosis not present

## 2023-09-16 DIAGNOSIS — Z8249 Family history of ischemic heart disease and other diseases of the circulatory system: Secondary | ICD-10-CM | POA: Diagnosis not present

## 2023-09-16 DIAGNOSIS — I871 Compression of vein: Secondary | ICD-10-CM | POA: Diagnosis not present

## 2023-09-16 DIAGNOSIS — Z86718 Personal history of other venous thrombosis and embolism: Secondary | ICD-10-CM | POA: Diagnosis not present

## 2023-09-16 DIAGNOSIS — Z8719 Personal history of other diseases of the digestive system: Secondary | ICD-10-CM | POA: Diagnosis not present

## 2023-09-16 DIAGNOSIS — E876 Hypokalemia: Secondary | ICD-10-CM | POA: Diagnosis not present

## 2023-09-16 DIAGNOSIS — R030 Elevated blood-pressure reading, without diagnosis of hypertension: Secondary | ICD-10-CM | POA: Diagnosis not present

## 2023-09-16 DIAGNOSIS — Z79899 Other long term (current) drug therapy: Secondary | ICD-10-CM | POA: Diagnosis not present

## 2023-09-16 DIAGNOSIS — M25562 Pain in left knee: Secondary | ICD-10-CM | POA: Diagnosis not present

## 2023-09-16 DIAGNOSIS — Y710 Diagnostic and monitoring cardiovascular devices associated with adverse incidents: Secondary | ICD-10-CM | POA: Diagnosis not present

## 2023-09-16 DIAGNOSIS — D649 Anemia, unspecified: Secondary | ICD-10-CM | POA: Diagnosis not present

## 2023-09-16 DIAGNOSIS — N390 Urinary tract infection, site not specified: Secondary | ICD-10-CM | POA: Diagnosis not present

## 2023-09-16 DIAGNOSIS — M1990 Unspecified osteoarthritis, unspecified site: Secondary | ICD-10-CM | POA: Diagnosis not present

## 2023-09-16 DIAGNOSIS — Z7902 Long term (current) use of antithrombotics/antiplatelets: Secondary | ICD-10-CM | POA: Diagnosis not present

## 2023-09-29 DIAGNOSIS — M79605 Pain in left leg: Secondary | ICD-10-CM | POA: Diagnosis not present

## 2023-09-29 DIAGNOSIS — I82432 Acute embolism and thrombosis of left popliteal vein: Secondary | ICD-10-CM | POA: Diagnosis not present

## 2023-09-29 DIAGNOSIS — J383 Other diseases of vocal cords: Secondary | ICD-10-CM | POA: Diagnosis not present

## 2023-10-21 DIAGNOSIS — S0511XA Contusion of eyeball and orbital tissues, right eye, initial encounter: Secondary | ICD-10-CM | POA: Diagnosis not present

## 2023-10-21 DIAGNOSIS — I82432 Acute embolism and thrombosis of left popliteal vein: Secondary | ICD-10-CM | POA: Diagnosis not present

## 2023-11-21 DIAGNOSIS — H00011 Hordeolum externum right upper eyelid: Secondary | ICD-10-CM | POA: Diagnosis not present

## 2023-11-21 DIAGNOSIS — Z86718 Personal history of other venous thrombosis and embolism: Secondary | ICD-10-CM | POA: Diagnosis not present

## 2023-11-21 DIAGNOSIS — J382 Nodules of vocal cords: Secondary | ICD-10-CM | POA: Diagnosis not present

## 2023-11-21 DIAGNOSIS — M549 Dorsalgia, unspecified: Secondary | ICD-10-CM | POA: Diagnosis not present

## 2023-11-28 DIAGNOSIS — E876 Hypokalemia: Secondary | ICD-10-CM | POA: Diagnosis not present

## 2023-12-20 DIAGNOSIS — R490 Dysphonia: Secondary | ICD-10-CM | POA: Diagnosis not present

## 2023-12-20 DIAGNOSIS — H6981 Other specified disorders of Eustachian tube, right ear: Secondary | ICD-10-CM | POA: Diagnosis not present

## 2023-12-20 DIAGNOSIS — J383 Other diseases of vocal cords: Secondary | ICD-10-CM | POA: Diagnosis not present

## 2023-12-20 DIAGNOSIS — J301 Allergic rhinitis due to pollen: Secondary | ICD-10-CM | POA: Diagnosis not present

## 2023-12-27 NOTE — H&P (View-Only) (Signed)
 Patient ID: [name], female   DOB: Jul 11, 1951, 73 y.o.   MRN: [id number]  Reason for Consult: New Patient (Initial Visit)   Referred by Joy Grave, MD  Subjective:     HPI  [name] is a 73 y.o. female with history of a left leg DVT in January 2025.  At that time she was in [place]  and underwent thrombectomy with stent placement and possible IVC filter placement.  She is on Eliquis and Plavix and was told that she could have her filter removed but would need to follow-up with her local vascular surgeon. She does have some swelling in the left leg when standing for a prolonged period.  She does have compression stockings although does not wear them that often.  She denies significant aching or pain in the left leg.  Past Medical History:  Diagnosis Date   Allergic rhinitis    Anxiety    Depression    Diverticulosis    HEMMORHOIDS ON COLONOSCOPY   DVT (deep venous thrombosis) (HCC)    IBS (irritable bowel syndrome)    DIARRHEA PRONE   OA (osteoarthritis)    HANDS   Skin cancer, basal cell    HX skin cancer per patient   TMJ syndrome    Vitamin D deficiency    Family History  Problem Relation Age of Onset   Hypertension Mother    Cancer - Other Mother    Lung cancer Father    Cancer - Other Brother    CVA Brother    Heart attack Brother    AAA (abdominal aortic aneurysm) Sister    Depression Sister    Past Surgical History:  Procedure Laterality Date   BONE SPURS REMOVAL     TOES   BREAST BIOPSY     RIGHT   BREAST ENHANCEMENT SURGERY     THEN REMOVED   CHOLECYSTECTOMY N/A 06/01/2016   Procedure: LAPAROSCOPIC CHOLECYSTECTOMY WITH INTRAOPERATIVE CHOLANGIOGRAM;  Surgeon: Oza Blumenthal, MD;  Location: [facility] OR;  Service: General;  Laterality: N/A;   FOOT SURGERY     Right   SHOULDER SURGERY     RIGHT   TUBAL LIGATION     VESICOVAGINAL FISTULA CLOSURE W/ TAH     WITHOUT OOPHS    Short Social History:  Social History   Tobacco Use    Smoking status: Never   Smokeless tobacco: Never  Substance Use Topics   Alcohol use: No    Allergies  Allergen Reactions   Ketorolac  Nausea Only   Morphine Nausea Only and Other (See Comments)    Jumpy     Oxycodone-Acetaminophen  Nausea Only and Nausea And Vomiting   Pentazocine Nausea Only   Cefuroxime Other (See Comments)    Back pain   Ketorolac  Tromethamine  Nausea And Vomiting   Morphine And Codeine Other (See Comments)    Jumpy    Nitrofurantoin  Other (See Comments)    Back pain   Oxycodone Nausea And Vomiting   Percocet [Oxycodone-Acetaminophen ] Nausea And Vomiting   Zoloft [Sertraline Hcl]     DIARRHEA     Current Outpatient Medications  Medication Sig Dispense Refill   apixaban (ELIQUIS) 5 MG TABS tablet Take 5 mg by mouth.     Cholecalciferol (VITAMIN D) 2000 UNITS CAPS Take 5,000 Units by mouth daily.      clopidogrel (PLAVIX) 75 MG tablet Take 75 mg by mouth.     colestipol (COLESTID) 1 g tablet Take 1 g by mouth 2 (two) times daily.  omeprazole (PRILOSEC) 20 MG capsule Take 20 mg by mouth.     No current facility-administered medications for this visit.    REVIEW OF SYSTEMS  All other systems were reviewed and are negative     Objective:  Objective   Vitals:   12/30/23 0849  BP: 128/62  Pulse: 62  Resp: 20  Temp: 97.8 F (36.6 C)  SpO2: 95%  Weight: 167 lb (75.8 kg)  Height: 5\' 3"  (1.6 m)   Body mass index is 29.58 kg/m.  Physical Exam General: no acute distress Cardiac: hemodynamically stable Pulm: normal work of breathing Neuro: alert, no focal deficit Extremities: no edema, cyanosis or wounds Vascular:   Right: Palpable radial, brachial, PT  Left: Palpable radial, brachial, PT   Data: Venous Duplex +---------+---------------+---------+-----------+----------+--------------+   LEFT    CompressibilityPhasicitySpontaneityPropertiesThrombus  Aging   +---------+---------------+---------+-----------+----------+--------------+   CFV     Full           Yes      Yes                                   +---------+---------------+---------+-----------+----------+--------------+   SFJ     Full           Yes      Yes                                   +---------+---------------+---------+-----------+----------+--------------+   FV Prox  Full           Yes      Yes                                   +---------+---------------+---------+-----------+----------+--------------+   FV Mid   Full           Yes      Yes                                   +---------+---------------+---------+-----------+----------+--------------+   FV DistalFull           Yes      Yes                                   +---------+---------------+---------+-----------+----------+--------------+   PFV     Full           Yes      Yes                                   +---------+---------------+---------+-----------+----------+--------------+   POP     Full           Yes      Yes                                   +---------+---------------+---------+-----------+----------+--------------+   PTV     Full           Yes      No                                    +---------+---------------+---------+-----------+----------+--------------+  PERO    Full           Yes      Yes                                   +---------+---------------+---------+-----------+----------+--------------+   Gastroc Full           Yes      Yes                                   +---------+---------------+---------+-----------+----------+--------------+   GSV     Full           No       Yes                                   +---------+---------------+---------+-----------+----------+--------------+   SSV     Full           Yes      Yes                                    +---------+---------------+---------+-----------+----------+--------------+  Summary:  LEFT:  - There is no visualized evidence of deep vein thrombosis in the lower  extremity.  - There is no evidence of superficial venous thrombosis.     IVC duplex IVC/Iliac Findings:  +----------+------+--------+--------+    IVC    PatentThrombusComments  +----------+------+--------+--------+  IVC Prox  patent                  +----------+------+--------+--------+  IVC Mid   patent                  +----------+------+--------+--------+  IVC Distalpatent                  +----------+------+--------+--------+   +-------------------+---------+-----------+---------+-----------+--------+         CIV        RT-PatentRT-ThrombusLT-PatentLT-ThrombusComments  +-------------------+---------+-----------+---------+-----------+--------+  Common Iliac Prox   patent              patent                       +-------------------+---------+-----------+---------+-----------+--------+  Common Iliac Mid    patent              patent                       +-------------------+---------+-----------+---------+-----------+--------+  Common Iliac Distal patent              patent                       +-------------------+---------+-----------+---------+-----------+--------+   +-------------------------+---------+-----------+---------+-----------+----  ----+            EIV            RT-PatentRT-ThrombusLT-PatentLT-ThrombusComments  +-------------------------+---------+-----------+---------+-----------+----  ----+  External Iliac Vein Prox  patent              patent                        +-------------------------+---------+-----------+---------+-----------+----  ----+  External Iliac Vein Mid   patent  patent                        +-------------------------+---------+-----------+---------+-----------+----  ----+  External  Iliac Vein       patent                                            Distal                                                                      +-------------------------+---------+-----------+---------+-----------+----  ----+   Summary:  IVC/Iliac: Patent IVC filter with no visualized thrombosis.  There appears to be chronic thrombus in the proximal ICA.        Assessment/Plan:     BERENISSE ROBOTHAM is a 73 y.o. female who presents to establish care after undergoing a left leg DVT thrombectomy with stent placement and IVC filter placement.  Explained that we can plan to take the IVC filter out.  We also discussed that her duplex looks great without any residual signs of clot.  She should continue the Eliquis, I explained that there is no need to hold through the perioperative period.  I also explained that she should only need to be treated for total of 6 months we will plan to stop her Eliquis in June.  Plan for IVC filter removal in the next few weeks in the Cath Lab.     Philipp Brawn MD [facility]

## 2023-12-27 NOTE — Progress Notes (Signed)
 Patient ID: [name], female   DOB: Jul 11, 1951, 73 y.o.   MRN: [id number]  Reason for Consult: New Patient (Initial Visit)   Referred by Victorio Grave, MD  Subjective:     HPI  [name] is a 73 y.o. female with history of a left leg DVT in January 2025.  At that time she was in [place]  and underwent thrombectomy with stent placement and possible IVC filter placement.  She is on Eliquis and Plavix and was told that she could have her filter removed but would need to follow-up with her local vascular surgeon. She does have some swelling in the left leg when standing for a prolonged period.  She does have compression stockings although does not wear them that often.  She denies significant aching or pain in the left leg.  Past Medical History:  Diagnosis Date   Allergic rhinitis    Anxiety    Depression    Diverticulosis    HEMMORHOIDS ON COLONOSCOPY   DVT (deep venous thrombosis) (HCC)    IBS (irritable bowel syndrome)    DIARRHEA PRONE   OA (osteoarthritis)    HANDS   Skin cancer, basal cell    HX skin cancer per patient   TMJ syndrome    Vitamin D deficiency    Family History  Problem Relation Age of Onset   Hypertension Mother    Cancer - Other Mother    Lung cancer Father    Cancer - Other Brother    CVA Brother    Heart attack Brother    AAA (abdominal aortic aneurysm) Sister    Depression Sister    Past Surgical History:  Procedure Laterality Date   BONE SPURS REMOVAL     TOES   BREAST BIOPSY     RIGHT   BREAST ENHANCEMENT SURGERY     THEN REMOVED   CHOLECYSTECTOMY N/A 06/01/2016   Procedure: LAPAROSCOPIC CHOLECYSTECTOMY WITH INTRAOPERATIVE CHOLANGIOGRAM;  Surgeon: Oza Blumenthal, MD;  Location: [facility] OR;  Service: General;  Laterality: N/A;   FOOT SURGERY     Right   SHOULDER SURGERY     RIGHT   TUBAL LIGATION     VESICOVAGINAL FISTULA CLOSURE W/ TAH     WITHOUT OOPHS    Short Social History:  Social History   Tobacco Use    Smoking status: Never   Smokeless tobacco: Never  Substance Use Topics   Alcohol use: No    Allergies  Allergen Reactions   Ketorolac  Nausea Only   Morphine Nausea Only and Other (See Comments)    Jumpy     Oxycodone-Acetaminophen  Nausea Only and Nausea And Vomiting   Pentazocine Nausea Only   Cefuroxime Other (See Comments)    Back pain   Ketorolac  Tromethamine  Nausea And Vomiting   Morphine And Codeine Other (See Comments)    Jumpy    Nitrofurantoin  Other (See Comments)    Back pain   Oxycodone Nausea And Vomiting   Percocet [Oxycodone-Acetaminophen ] Nausea And Vomiting   Zoloft [Sertraline Hcl]     DIARRHEA     Current Outpatient Medications  Medication Sig Dispense Refill   apixaban (ELIQUIS) 5 MG TABS tablet Take 5 mg by mouth.     Cholecalciferol (VITAMIN D) 2000 UNITS CAPS Take 5,000 Units by mouth daily.      clopidogrel (PLAVIX) 75 MG tablet Take 75 mg by mouth.     colestipol (COLESTID) 1 g tablet Take 1 g by mouth 2 (two) times daily.  omeprazole (PRILOSEC) 20 MG capsule Take 20 mg by mouth.     No current facility-administered medications for this visit.    REVIEW OF SYSTEMS  All other systems were reviewed and are negative     Objective:  Objective   Vitals:   12/30/23 0849  BP: 128/62  Pulse: 62  Resp: 20  Temp: 97.8 F (36.6 C)  SpO2: 95%  Weight: 167 lb (75.8 kg)  Height: 5\' 3"  (1.6 m)   Body mass index is 29.58 kg/m.  Physical Exam General: no acute distress Cardiac: hemodynamically stable Pulm: normal work of breathing Neuro: alert, no focal deficit Extremities: no edema, cyanosis or wounds Vascular:   Right: Palpable radial, brachial, PT  Left: Palpable radial, brachial, PT   Data: Venous Duplex +---------+---------------+---------+-----------+----------+--------------+   LEFT    CompressibilityPhasicitySpontaneityPropertiesThrombus  Aging   +---------+---------------+---------+-----------+----------+--------------+   CFV     Full           Yes      Yes                                   +---------+---------------+---------+-----------+----------+--------------+   SFJ     Full           Yes      Yes                                   +---------+---------------+---------+-----------+----------+--------------+   FV Prox  Full           Yes      Yes                                   +---------+---------------+---------+-----------+----------+--------------+   FV Mid   Full           Yes      Yes                                   +---------+---------------+---------+-----------+----------+--------------+   FV DistalFull           Yes      Yes                                   +---------+---------------+---------+-----------+----------+--------------+   PFV     Full           Yes      Yes                                   +---------+---------------+---------+-----------+----------+--------------+   POP     Full           Yes      Yes                                   +---------+---------------+---------+-----------+----------+--------------+   PTV     Full           Yes      No                                    +---------+---------------+---------+-----------+----------+--------------+  PERO    Full           Yes      Yes                                   +---------+---------------+---------+-----------+----------+--------------+   Gastroc Full           Yes      Yes                                   +---------+---------------+---------+-----------+----------+--------------+   GSV     Full           No       Yes                                   +---------+---------------+---------+-----------+----------+--------------+   SSV     Full           Yes      Yes                                    +---------+---------------+---------+-----------+----------+--------------+  Summary:  LEFT:  - There is no visualized evidence of deep vein thrombosis in the lower  extremity.  - There is no evidence of superficial venous thrombosis.     IVC duplex IVC/Iliac Findings:  +----------+------+--------+--------+    IVC    PatentThrombusComments  +----------+------+--------+--------+  IVC Prox  patent                  +----------+------+--------+--------+  IVC Mid   patent                  +----------+------+--------+--------+  IVC Distalpatent                  +----------+------+--------+--------+   +-------------------+---------+-----------+---------+-----------+--------+         CIV        RT-PatentRT-ThrombusLT-PatentLT-ThrombusComments  +-------------------+---------+-----------+---------+-----------+--------+  Common Iliac Prox   patent              patent                       +-------------------+---------+-----------+---------+-----------+--------+  Common Iliac Mid    patent              patent                       +-------------------+---------+-----------+---------+-----------+--------+  Common Iliac Distal patent              patent                       +-------------------+---------+-----------+---------+-----------+--------+   +-------------------------+---------+-----------+---------+-----------+----  ----+            EIV            RT-PatentRT-ThrombusLT-PatentLT-ThrombusComments  +-------------------------+---------+-----------+---------+-----------+----  ----+  External Iliac Vein Prox  patent              patent                        +-------------------------+---------+-----------+---------+-----------+----  ----+  External Iliac Vein Mid   patent  patent                        +-------------------------+---------+-----------+---------+-----------+----  ----+  External  Iliac Vein       patent                                            Distal                                                                      +-------------------------+---------+-----------+---------+-----------+----  ----+   Summary:  IVC/Iliac: Patent IVC filter with no visualized thrombosis.  There appears to be chronic thrombus in the proximal ICA.        Assessment/Plan:     [name] is a 73 y.o. female who presents to establish care after undergoing a left leg DVT thrombectomy with stent placement and IVC filter placement.  Explained that we can plan to take the IVC filter out.  We also discussed that her duplex looks great without any residual signs of clot.  She should continue the Eliquis, I explained that there is no need to hold through the perioperative period.  I also explained that she should only need to be treated for total of 6 months we will plan to stop her Eliquis in June.  Plan for IVC filter removal in the next few weeks in the Cath Lab.     Philipp Brawn MD [facility]

## 2023-12-28 ENCOUNTER — Other Ambulatory Visit: Payer: Self-pay

## 2023-12-28 DIAGNOSIS — I872 Venous insufficiency (chronic) (peripheral): Secondary | ICD-10-CM

## 2023-12-30 ENCOUNTER — Ambulatory Visit (HOSPITAL_COMMUNITY)
Admission: RE | Admit: 2023-12-30 | Discharge: 2023-12-30 | Disposition: A | Discharge status: Home / Self Care | Source: Ambulatory Visit | Attending: Vascular Surgery | Admitting: Vascular Surgery

## 2023-12-30 ENCOUNTER — Encounter: Payer: Self-pay | Admitting: Vascular Surgery

## 2023-12-30 ENCOUNTER — Telehealth: Payer: Self-pay

## 2023-12-30 ENCOUNTER — Ambulatory Visit: Attending: Vascular Surgery | Admitting: Vascular Surgery

## 2023-12-30 ENCOUNTER — Ambulatory Visit (HOSPITAL_BASED_OUTPATIENT_CLINIC_OR_DEPARTMENT_OTHER)
Admission: RE | Admit: 2023-12-30 | Discharge: 2023-12-30 | Disposition: A | Discharge status: Home / Self Care | Source: Ambulatory Visit | Attending: Vascular Surgery | Admitting: Vascular Surgery

## 2023-12-30 VITALS — BP 128/62 | HR 62 | Temp 97.8°F | Resp 20 | Ht 63.0 in | Wt 167.0 lb

## 2023-12-30 DIAGNOSIS — I872 Venous insufficiency (chronic) (peripheral): Secondary | ICD-10-CM | POA: Diagnosis not present

## 2023-12-30 DIAGNOSIS — I82492 Acute embolism and thrombosis of other specified deep vein of left lower extremity: Secondary | ICD-10-CM

## 2023-12-30 DIAGNOSIS — Z95828 Presence of other vascular implants and grafts: Secondary | ICD-10-CM

## 2023-12-30 NOTE — Telephone Encounter (Signed)
 Attempted to call for surgery scheduling. LVM

## 2024-01-05 ENCOUNTER — Other Ambulatory Visit: Payer: Self-pay

## 2024-01-05 DIAGNOSIS — Z95828 Presence of other vascular implants and grafts: Secondary | ICD-10-CM

## 2024-01-09 ENCOUNTER — Ambulatory Visit (HOSPITAL_COMMUNITY)
Admission: RE | Admit: 2024-01-09 | Discharge: 2024-01-09 | Disposition: A | Discharge status: Home / Self Care | Attending: Vascular Surgery | Admitting: Vascular Surgery

## 2024-01-09 ENCOUNTER — Other Ambulatory Visit: Payer: Self-pay

## 2024-01-09 ENCOUNTER — Encounter (HOSPITAL_COMMUNITY): Payer: Self-pay | Admitting: Vascular Surgery

## 2024-01-09 ENCOUNTER — Encounter (HOSPITAL_COMMUNITY)
Admission: RE | Disposition: A | Discharge status: Home / Self Care | Payer: Self-pay | Source: Home / Self Care | Attending: Vascular Surgery

## 2024-01-09 DIAGNOSIS — Z95820 Peripheral vascular angioplasty status with implants and grafts: Secondary | ICD-10-CM | POA: Insufficient documentation

## 2024-01-09 DIAGNOSIS — Z86718 Personal history of other venous thrombosis and embolism: Secondary | ICD-10-CM

## 2024-01-09 DIAGNOSIS — Z7901 Long term (current) use of anticoagulants: Secondary | ICD-10-CM | POA: Diagnosis not present

## 2024-01-09 DIAGNOSIS — Z4589 Encounter for adjustment and management of other implanted devices: Secondary | ICD-10-CM | POA: Insufficient documentation

## 2024-01-09 DIAGNOSIS — Z7902 Long term (current) use of antithrombotics/antiplatelets: Secondary | ICD-10-CM | POA: Insufficient documentation

## 2024-01-09 DIAGNOSIS — Z95828 Presence of other vascular implants and grafts: Secondary | ICD-10-CM

## 2024-01-09 HISTORY — PX: IVC FILTER REMOVAL: CATH118246

## 2024-01-09 LAB — POCT I-STAT, CHEM 8
BUN: 11 mg/dL (ref 8–23)
Calcium, Ion: 1.36 mmol/L (ref 1.15–1.40)
Chloride: 107 mmol/L (ref 98–111)
Creatinine, Ser: 0.7 mg/dL (ref 0.44–1.00)
Glucose, Bld: 104 mg/dL — ABNORMAL HIGH (ref 70–99)
HCT: 41 % (ref 36.0–46.0)
Hemoglobin: 13.9 g/dL (ref 12.0–15.0)
Potassium: 3.7 mmol/L (ref 3.5–5.1)
Sodium: 143 mmol/L (ref 135–145)
TCO2: 22 mmol/L (ref 22–32)

## 2024-01-09 SURGERY — IVC FILTER REMOVAL
Anesthesia: LOCAL

## 2024-01-09 MED ORDER — LIDOCAINE HCL (PF) 1 % IJ SOLN
INTRAMUSCULAR | Status: DC | PRN
  Administered 2024-01-09: 10 mL

## 2024-01-09 MED ORDER — MIDAZOLAM HCL 2 MG/2ML IJ SOLN
INTRAMUSCULAR | Status: DC | PRN
  Administered 2024-01-09: 1 mg via INTRAVENOUS

## 2024-01-09 MED ORDER — SODIUM CHLORIDE 0.9% FLUSH: 3.0000 mL | Freq: Two times a day (BID) | INTRAVENOUS | Status: DC

## 2024-01-09 MED ORDER — HYDRALAZINE HCL 20 MG/ML IJ SOLN: 5.0000 mg | INTRAMUSCULAR | Status: DC | PRN

## 2024-01-09 MED ORDER — LABETALOL HCL 5 MG/ML IV SOLN: 10.0000 mg | INTRAVENOUS | Status: DC | PRN

## 2024-01-09 MED ORDER — FENTANYL CITRATE (PF) 100 MCG/2ML IJ SOLN
INTRAMUSCULAR | Status: DC | PRN
  Administered 2024-01-09: 50 ug via INTRAVENOUS

## 2024-01-09 MED ORDER — SODIUM CHLORIDE 0.9% FLUSH: 3.0000 mL | INTRAVENOUS | Status: DC | PRN

## 2024-01-09 MED ORDER — ACETAMINOPHEN 325 MG PO TABS: 650.0000 mg | ORAL_TABLET | ORAL | Status: DC | PRN

## 2024-01-09 MED ORDER — LIDOCAINE HCL (PF) 1 % IJ SOLN
INTRAMUSCULAR | Status: AC
  Filled 2024-01-09: qty 30

## 2024-01-09 MED ORDER — FENTANYL CITRATE (PF) 100 MCG/2ML IJ SOLN
INTRAMUSCULAR | Status: AC
  Filled 2024-01-09: qty 2

## 2024-01-09 MED ORDER — ONDANSETRON HCL 4 MG/2ML IJ SOLN: 4.0000 mg | Freq: Four times a day (QID) | INTRAMUSCULAR | Status: DC | PRN

## 2024-01-09 MED ORDER — SODIUM CHLORIDE 0.9 % IV SOLN: 250.0000 mL | INTRAVENOUS | Status: DC | PRN

## 2024-01-09 MED ORDER — SODIUM CHLORIDE 0.9 % IV SOLN: INTRAVENOUS | Status: DC

## 2024-01-09 MED ORDER — MIDAZOLAM HCL 2 MG/2ML IJ SOLN
INTRAMUSCULAR | Status: AC
  Filled 2024-01-09: qty 2

## 2024-01-09 MED ORDER — HEPARIN (PORCINE) IN NACL 2000-0.9 UNIT/L-% IV SOLN
INTRAVENOUS | Status: DC | PRN
  Administered 2024-01-09: 1000 mL

## 2024-01-09 MED ORDER — SODIUM CHLORIDE 0.9 % WEIGHT BASED INFUSION: 1.0000 mL/kg/h | INTRAVENOUS | Status: DC

## 2024-01-09 SURGICAL SUPPLY — 7 items
KIT MICROPUNCTURE NIT STIFF (SHEATH) IMPLANT
KIT PV (KITS) ×2 IMPLANT
SET CLOVERSNARE FLT RETRIEVAL (MISCELLANEOUS) IMPLANT
SYR MEDRAD MARK 7 150ML (SYRINGE) ×2 IMPLANT
TRANSDUCER W/STOPCOCK (MISCELLANEOUS) ×2 IMPLANT
TRAY PV CATH (CUSTOM PROCEDURE TRAY) ×2 IMPLANT
WIRE BENTSON .035X145CM (WIRE) IMPLANT

## 2024-01-09 NOTE — Op Note (Signed)
    Patient name: [name] MRN: [id number] DOB: 1951/02/13 Sex: female  01/09/2024 Pre-operative Diagnosis: Previous DVT Post-operative diagnosis:  Same Surgeon:  Philipp Brawn, MD Procedure Performed:  Ultrasound-guided access of the right internal jugular Diagnostic fluoroscopy from left knee to the chest   Indications: This is a 73 year old female with history of a left leg DVT in January, at that time she was in [place]  and underwent thrombectomy with left iliac stent placement and IVC filter placement per report.  She has been on Eliquis and Plavix and was told that she would need to have the filter removed by her local vascular surgeon when she is home.  She was seen in the clinic and venous duplex was obtained which demonstrated a patent filter/stent and she denied any significant swelling in either leg at that time.  We discussed IVC filter removal, risks and benefits were reviewed she expressed understanding and elected to proceed.  Findings:  No IVC filter present from left knee up to chest Fluoroscopic images saved Left iliac vein stent present, appears fully expanded   Procedure:  The patient was identified in the holding area and taken to the cath lab  The patient was then placed supine on the table and prepped and draped in the usual sterile fashion.  A time out was called.  Ultrasound was used to evaluate the right internal jugular vein which was patent and compressible.  This was accessed under ultrasound guidance after anesthetized with lidocaine .  Wire was placed into the SVC.  At this time I noted that there did not appear to be a filter present in the lower chest.  I then took fluoroscopy images from the left knee up through the chest and there was no IVC filter present.  These images were saved, the wire was removed and manual pressure was held.  Impression: No IVC filter present, left iliac vein stent fully expanded.   Philipp Brawn MD [facility] Office: [phone]

## 2024-01-09 NOTE — Interval H&P Note (Signed)
 History and Physical Interval Note:  01/09/2024 7:38 AM  [name]  has presented today for surgery, with the diagnosis of DVT.  The various methods of treatment have been discussed with the patient and family. After consideration of risks, benefits and other options for treatment, the patient has consented to  Procedure(s): IVC FILTER REMOVAL (N/A) as a surgical intervention.  The patient's history has been reviewed, patient examined, no change in status, stable for surgery.  I have reviewed the patient's chart and labs.  Questions were answered to the patient's satisfaction.     Philipp Brawn

## 2024-01-11 DIAGNOSIS — S46011D Strain of muscle(s) and tendon(s) of the rotator cuff of right shoulder, subsequent encounter: Secondary | ICD-10-CM | POA: Diagnosis not present

## 2024-01-27 DIAGNOSIS — H00022 Hordeolum internum right lower eyelid: Secondary | ICD-10-CM | POA: Diagnosis not present

## 2024-01-30 ENCOUNTER — Other Ambulatory Visit: Payer: Self-pay | Admitting: *Deleted

## 2024-01-30 DIAGNOSIS — I82492 Acute embolism and thrombosis of other specified deep vein of left lower extremity: Secondary | ICD-10-CM

## 2024-01-30 DIAGNOSIS — I872 Venous insufficiency (chronic) (peripheral): Secondary | ICD-10-CM

## 2024-01-31 DIAGNOSIS — J383 Other diseases of vocal cords: Secondary | ICD-10-CM | POA: Diagnosis not present

## 2024-01-31 DIAGNOSIS — K219 Gastro-esophageal reflux disease without esophagitis: Secondary | ICD-10-CM | POA: Diagnosis not present

## 2024-01-31 DIAGNOSIS — H6981 Other specified disorders of Eustachian tube, right ear: Secondary | ICD-10-CM | POA: Diagnosis not present

## 2024-01-31 DIAGNOSIS — J301 Allergic rhinitis due to pollen: Secondary | ICD-10-CM | POA: Diagnosis not present

## 2024-02-17 ENCOUNTER — Ambulatory Visit: Attending: Vascular Surgery | Admitting: Physician Assistant

## 2024-02-17 ENCOUNTER — Ambulatory Visit (HOSPITAL_COMMUNITY)
Admission: RE | Admit: 2024-02-17 | Discharge: 2024-02-17 | Disposition: A | Discharge status: Home / Self Care | Source: Ambulatory Visit | Attending: Vascular Surgery | Admitting: Vascular Surgery

## 2024-02-17 VITALS — BP 132/74 | HR 67 | Temp 97.9°F | Ht 63.0 in | Wt 163.7 lb

## 2024-02-17 DIAGNOSIS — I82492 Acute embolism and thrombosis of other specified deep vein of left lower extremity: Secondary | ICD-10-CM | POA: Diagnosis not present

## 2024-02-17 DIAGNOSIS — I872 Venous insufficiency (chronic) (peripheral): Secondary | ICD-10-CM

## 2024-02-17 NOTE — Progress Notes (Signed)
 HISTORY AND PHYSICAL     CC:  follow up. Requesting Provider:  Victorio Grave, MD  HPI: This is a 73 y.o. female who is here today for follow up for DVT.  Pt has hx of  left leg DVT in January, at that time she was in [place]  and underwent thrombectomy with left iliac stent placement and IVC filter placement per report.  She has been on Eliquis and Plavix and was told that she would need to have the filter removed by her local vascular surgeon when she is home.  She was seen in the clinic and venous duplex was obtained which demonstrated a patent filter/stent and she denied any significant swelling in either leg at that time.  We discussed IVC filter removal, risks and benefits were reviewed she expressed understanding and elected to proceed.  On 01/09/2024 she underwent angiogram and there was no IVC filter present from left knee to chest. There was a patent left iliac vein stent present.  The pt returns today for follow up and here with her husband of 57 years.  She states that she has occasional swelling in the left leg.  She states that since her blood clot, the left leg has always been a little bit bigger.  She states that Dr. Susi Eric told her the plan to stop Eliquis if her stent is patent today.  She denies any claudication or rest pain.    The pt is not on a statin for cholesterol management.    The pt is not on an aspirin .    Other AC:  Eliquis The pt is not on medication for hypertension.  The pt is not on medication for diabetes. Tobacco hx:  never  Pt does have family hx of AAA with her sister-she was a smoker.    Past Medical History:  Diagnosis Date   Allergic rhinitis    Anxiety    Depression    Diverticulosis    HEMMORHOIDS ON COLONOSCOPY   DVT (deep venous thrombosis) (HCC)    IBS (irritable bowel syndrome)    DIARRHEA PRONE   OA (osteoarthritis)    HANDS   Skin cancer, basal cell    HX skin cancer per patient   TMJ syndrome    Vitamin D deficiency     Past  Surgical History:  Procedure Laterality Date   BONE SPURS REMOVAL     TOES   BREAST BIOPSY     RIGHT   BREAST ENHANCEMENT SURGERY     THEN REMOVED   CHOLECYSTECTOMY N/A 06/01/2016   Procedure: LAPAROSCOPIC CHOLECYSTECTOMY WITH INTRAOPERATIVE CHOLANGIOGRAM;  Surgeon: Oza Blumenthal, MD;  Location: [facility] OR;  Service: General;  Laterality: N/A;   FOOT SURGERY     Right   IVC FILTER REMOVAL N/A 01/09/2024   Procedure: IVC FILTER REMOVAL;  Surgeon: Philipp Brawn, MD;  Location: [facility];  Service: Cardiovascular;  Laterality: N/A;   SHOULDER SURGERY     RIGHT   TUBAL LIGATION     VESICOVAGINAL FISTULA CLOSURE W/ TAH     WITHOUT OOPHS    Allergies  Allergen Reactions   Ketorolac  Nausea Only   Morphine Nausea Only and Other (See Comments)    Jumpy     Oxycodone-Acetaminophen  Nausea Only and Nausea And Vomiting   Pentazocine Nausea Only   Cefuroxime Other (See Comments)    Back pain   Ketorolac  Tromethamine  Nausea And Vomiting   Morphine And Codeine Other (See Comments)    Jumpy  Nitrofurantoin  Other (See Comments)    Back pain   Oxycodone Nausea And Vomiting   Percocet [Oxycodone-Acetaminophen ] Nausea And Vomiting   Zoloft [Sertraline Hcl]     DIARRHEA     Current Outpatient Medications  Medication Sig Dispense Refill   apixaban (ELIQUIS) 5 MG TABS tablet Take 5 mg by mouth.     colestipol (COLESTID) 1 g tablet Take 1 g by mouth 2 (two) times daily.     cyanocobalamin (VITAMIN B12) 1000 MCG tablet Take 1,000 mcg by mouth daily.     Echinacea 400 MG CAPS Take 400 mg by mouth daily as needed (in the fall).     Olopatadine-Mometasone (RYALTRIS) 665-25 MCG/ACT SUSP Place 1 spray into the nose 2 (two) times daily.     omeprazole (PRILOSEC) 20 MG capsule Take 20 mg by mouth daily as needed (Heartburn).     Polyvinyl Alcohol-Povidone PF 1.4-0.6 % SOLN Place 1 drop into both eyes daily as needed (Dryeyes). Refresh     VITAMIN D PO Take 5,000 Units by mouth daily.      zinc gluconate 50 MG tablet Take 50 mg by mouth daily.     No current facility-administered medications for this visit.    Family History  Problem Relation Age of Onset   Hypertension Mother    Cancer - Other Mother    Lung cancer Father    Cancer - Other Brother    CVA Brother    Heart attack Brother    AAA (abdominal aortic aneurysm) Sister    Depression Sister     Social History   Socioeconomic History   Marital status: Married    Spouse name: Not on file   Number of children: 2   Years of education: 10   Highest education level: Not on file  Occupational History   Not on file  Tobacco Use   Smoking status: Never   Smokeless tobacco: Never  Vaping Use   Vaping status: Never Used  Substance and Sexual Activity   Alcohol use: No   Drug use: No   Sexual activity: Not on file  Other Topics Concern   Not on file  Social History Narrative   Right handed   Dirnks caffeine on occ   One story home   Social Drivers of Health   Financial Resource Strain: Not on file  Food Insecurity: Low Risk  (09/17/2023)   Received from [facility]   [facility] Food Security    Within the past 12 months, the food you bought just didn't last and you didn't have money to get more.: 3    Within the past 12 months, you worried that your food would run out before you got money to buy more.: 3  Transportation Needs: Not At Risk (09/21/2023)   Received from [facility]   [facility] Transportation Needs    In the past 12 months, has lack of reliable transportation kept you from medical appointments, meetings, work or from getting things needed for daily living?: No  Physical Activity: Not on file  Stress: No Stress Concern Present (09/17/2023)   Received from [facility]   [employer] of Occupational Health - Occupational Stress Questionnaire    Feeling of Stress : Not at all  Social Connections: Not on file  Intimate Partner Violence: Not At Risk (09/17/2023)   Received from [facility]    [facility] Safety    How often does anyone, including family and friends, threaten you with harm?: 1    How often does  anyone, including family and friends, insult or talk down to you?: 1    How often does anyone, including family and friends, physically hurt you?: 1    How often does anyone, including family and friends, scream or curse at you?: 1     REVIEW OF SYSTEMS:   [X]  denotes positive finding, [ ]  denotes negative finding Cardiac  Comments:  Chest pain or chest pressure:    Shortness of breath upon exertion:    Short of breath when lying flat:    Irregular heart rhythm:        Vascular    Pain in calf, thigh, or hip brought on by ambulation:    Pain in feet at night that wakes you up from your sleep:     Blood clot in your veins: x   Leg swelling:         Pulmonary    Oxygen  at home:    Productive cough:     Wheezing:         Neurologic    Sudden weakness in arms or legs:     Sudden numbness in arms or legs:     Sudden onset of difficulty speaking or slurred speech:    Temporary loss of vision in one eye:     Problems with dizziness:         Gastrointestinal    Blood in stool:     Vomited blood:         Genitourinary    Burning when urinating:     Blood in urine:        Psychiatric    Major depression:         Hematologic    Bleeding problems:    Problems with blood clotting too easily:        Skin    Rashes or ulcers:        Constitutional    Fever or chills:      PHYSICAL EXAMINATION:  Today's Vitals   02/17/24 0951  BP: 132/74  Pulse: 67  Temp: 97.9 F (36.6 C)  TempSrc: Temporal  SpO2: 94%  Weight: 163 lb 11.2 oz (74.3 kg)  Height: 5' 3 (1.6 m)  PainSc: 0-No pain   Body mass index is 29 kg/m.   General:  WDWN in NAD; vital signs documented above Gait: Not observed HENT: WNL, normocephalic Pulmonary: normal non-labored breathing , without wheezing Cardiac: regular HR, without carotid bruits Abdomen: soft, NT; aortic pulse is not  palpable Skin: without rashes Vascular Exam/Pulses:  Right Left  Radial 2+ (normal) 2+ (normal)  DP 2+ (normal) 2+ (normal)   Extremities: without ischemic changes, without Gangrene , without cellulitis; without open wounds; minimal left leg swelling Musculoskeletal: no muscle wasting or atrophy  Neurologic: A&O X 3 Psychiatric:  The pt has Normal affect.   Non-Invasive Vascular Imaging:   Venous duplex on 02/17/2024: IVC/Iliac Findings:  +----------+------+--------+--------+    IVC    PatentThrombusComments  +----------+------+--------+--------+  IVC Prox  patent                  +----------+------+--------+--------+  IVC Mid   patent                  +----------+------+--------+--------+  IVC Distalpatent                  +----------+------+--------+--------+     +-------------------+---------+-----------+---------+-----------+--------+         CIV  RT-PatentRT-ThrombusLT-PatentLT-ThrombusComments  +-------------------+---------+-----------+---------+-----------+--------+  Common Iliac Prox   patent              patent                       +-------------------+---------+-----------+---------+-----------+--------+  Common Iliac Mid    patent              patent                       +-------------------+---------+-----------+---------+-----------+--------+  Common Iliac Distal patent              patent                       +-------------------+---------+-----------+---------+-----------+--------+      +-------------------------+---------+-----------+---------+-----------+----  ----+            EIV            RT-PatentRT-ThrombusLT-PatentLT-ThrombusComments  +-------------------------+---------+-----------+---------+-----------+----  ----+  External Iliac Vein Prox  patent              patent                        +-------------------------+---------+-----------+---------+-----------+----  ----+   External Iliac Vein Mid   patent              patent                        +-------------------------+---------+-----------+---------+-----------+----  ----+  External Iliac Vein       patent              patent                        Distal                                                                      +-------------------------+---------+-----------+---------+-----------+----  ----+        Summary:  IVC/Iliac: No evidence of thrombus in IVC and Iliac veins. There is no  evidence of thrombus involving the right common iliac vein and left common  iliac vein. There is no evidence of thrombus involving the right external  iliac vein and left external iliac   vein. Widely patent left common to external iliac vein stent    Previous Venous duplex on 12/30/2023: IVC/Iliac Findings:  +----------+------+--------+--------+    IVC    PatentThrombusComments  +----------+------+--------+--------+  IVC Prox  patent                  +----------+------+--------+--------+  IVC Mid   patent                  +----------+------+--------+--------+  IVC Distalpatent                  +----------+------+--------+--------+     +-------------------+---------+-----------+---------+-----------+--------+         CIV        RT-PatentRT-ThrombusLT-PatentLT-ThrombusComments  +-------------------+---------+-----------+---------+-----------+--------+  Common Iliac Prox   patent              patent                       +-------------------+---------+-----------+---------+-----------+--------+  Common Iliac Mid    patent              patent                       +-------------------+---------+-----------+---------+-----------+--------+  Common Iliac Distal patent              patent                       +-------------------+---------+-----------+---------+-----------+--------+     +-------------------------+---------+-----------+---------+-----------+----            EIV            RT-PatentRT-ThrombusLT-PatentLT-ThrombusComments  +-------------------------+---------+-----------+---------+-----------+----   External Iliac Vein Prox  patent              patent                  +-------------------------+---------+-----------+---------+-----------+----  External Iliac Vein Mid   patent              patent                  +-------------------------+---------+-----------+---------+-----------+----  External Iliac Vein       patent                                      Distal                                                                +-------------------------+---------+-----------+---------+-----------+----     ASSESSMENT/PLAN:: 73 y.o. female  with hx of left leg DVT in January, at that time she was in [place]  and underwent thrombectomy with left iliac stent placement and IVC filter placement per report.  She has been on Eliquis and Plavix and was told that she would need to have the filter removed by her local vascular surgeon when she is home.  She was seen in the clinic and venous duplex was obtained which demonstrated a patent filter/stent and she denied any significant swelling in either leg at that time.  We discussed IVC filter removal, risks and benefits were reviewed she expressed understanding and elected to proceed.  On 01/09/2024 she underwent angiogram and there was no IVC filter present from left knee to chest. There was a patent left iliac vein stent present.   -pt doing well with minimal swelling -stent is widely patent today.  Discussed she can stop Eliquis and start taking a baby asa daily.  Discussed if she develops another DVT, she would nee AC indefinitely.   -pt will f/u in one year with IVC/iliac venous duplex.  Family hx of AAA -given family hx, will get medicare screen when she returns.   -fortunately she is  not a smoker and her aorta is not palpable.   Maryanna Smart, [facility] [phone]  Clinic MD:   Susi Eric

## 2024-02-29 ENCOUNTER — Ambulatory Visit
Admission: RE | Admit: 2024-02-29 | Discharge: 2024-02-29 | Disposition: A | Discharge status: Home / Self Care | Source: Ambulatory Visit | Attending: Emergency Medicine | Admitting: Emergency Medicine

## 2024-02-29 VITALS — BP 139/76 | HR 98 | Temp 97.8°F | Resp 18

## 2024-02-29 DIAGNOSIS — R8290 Unspecified abnormal findings in urine: Secondary | ICD-10-CM | POA: Insufficient documentation

## 2024-02-29 DIAGNOSIS — M545 Low back pain, unspecified: Secondary | ICD-10-CM | POA: Insufficient documentation

## 2024-02-29 DIAGNOSIS — R1030 Lower abdominal pain, unspecified: Secondary | ICD-10-CM | POA: Diagnosis not present

## 2024-02-29 LAB — POCT URINALYSIS DIP (MANUAL ENTRY)
Glucose, UA: NEGATIVE mg/dL
Ketones, POC UA: NEGATIVE mg/dL
Nitrite, UA: NEGATIVE
Protein Ur, POC: 30 mg/dL — AB
Spec Grav, UA: 1.025 (ref 1.010–1.025)
Urobilinogen, UA: 0.2 U/dL
pH, UA: 6 (ref 5.0–8.0)

## 2024-02-29 MED ORDER — CEPHALEXIN 500 MG PO CAPS: 500.0000 mg | ORAL_CAPSULE | Freq: Three times a day (TID) | ORAL | 0 refills | Status: AC

## 2024-02-29 NOTE — Discharge Instructions (Addendum)
 Take the antibiotic as directed.  The urine culture is pending.  We will call you if it shows the need to change or discontinue your antibiotic.    Follow up with your primary care provider.  Go to the emergency department if you have worsening symptoms.

## 2024-02-29 NOTE — ED Provider Notes (Signed)
 [name]    CSN: [id number] Arrival date & time: 02/29/24  1327      History   Chief Complaint Chief Complaint  Patient presents with   Back Pain    HPI [name] is a 73 y.o. female.  Patient presents with 3-day history of bilateral low back pain, bilateral lower abdominal pain, malodorous urine.  Last bowel movement this morning.  She denies fever, chills, nausea, vomiting, diarrhea, constipation, dysuria, hematuria, vaginal discharge.  Treatment attempted with Tylenol .  She reports history of recurrent UTIs.  The history is provided by the patient and medical records.    Past Medical History:  Diagnosis Date   Allergic rhinitis    Anxiety    Depression    Diverticulosis    HEMMORHOIDS ON COLONOSCOPY   DVT (deep venous thrombosis) (HCC)    IBS (irritable bowel syndrome)    DIARRHEA PRONE   OA (osteoarthritis)    HANDS   Skin cancer, basal cell    HX skin cancer per patient   TMJ syndrome    Vitamin D deficiency     Patient Active Problem List   Diagnosis Date Noted   Pain in right foot 03/10/2021   Dupuytren's disease of palm 08/14/2018   Acute pancreatitis 05/31/2016   Pancreatitis 05/31/2016   Nausea and vomiting 05/31/2016   Leukocytosis 05/31/2016   Hyperglycemia 05/31/2016    Past Surgical History:  Procedure Laterality Date   BONE SPURS REMOVAL     TOES   BREAST BIOPSY     RIGHT   BREAST ENHANCEMENT SURGERY     THEN REMOVED   CHOLECYSTECTOMY N/A 06/01/2016   Procedure: LAPAROSCOPIC CHOLECYSTECTOMY WITH INTRAOPERATIVE CHOLANGIOGRAM;  Surgeon: Vicenta Poli, MD;  Location: [facility] OR;  Service: General;  Laterality: N/A;   FOOT SURGERY     Right   IVC FILTER REMOVAL N/A 01/09/2024   Procedure: IVC FILTER REMOVAL;  Surgeon: Pearline Norman GORMAN, MD;  Location: [facility];  Service: Cardiovascular;  Laterality: N/A;   SHOULDER SURGERY     RIGHT   TUBAL LIGATION     VESICOVAGINAL FISTULA CLOSURE W/ TAH     WITHOUT OOPHS     OB History   No obstetric history on file.      Home Medications    Prior to Admission medications   Medication Sig Start Date End Date Taking? Authorizing Provider  aspirin  EC 81 MG tablet Take 81 mg by mouth daily. Swallow whole.   Yes [provider]  cephALEXin  (KEFLEX ) 500 MG capsule Take 1 capsule (500 mg total) by mouth 3 (three) times daily for 5 days. 02/29/24 03/05/24 Yes Corlis Burnard DEL, NP  colestipol (COLESTID) 1 g tablet Take 1 g by mouth 2 (two) times daily.   Yes [provider]  cyanocobalamin (VITAMIN B12) 1000 MCG tablet Take 1,000 mcg by mouth daily.   Yes [provider]  VITAMIN D PO Take 5,000 Units by mouth daily.   Yes [provider]  zinc gluconate 50 MG tablet Take 50 mg by mouth daily.   Yes [provider]  apixaban (ELIQUIS) 5 MG TABS tablet Take 5 mg by mouth. Patient not taking: Reported on 02/29/2024 10/21/23 04/18/24  [provider]  Echinacea 400 MG CAPS Take 400 mg by mouth daily as needed (in the fall).    [provider]  Olopatadine-Mometasone (PARRIS) 665-25 MCG/ACT SUSP Place 1 spray into the nose 2 (two) times daily.    [provider]  omeprazole (PRILOSEC) 20 MG capsule Take 20 mg by mouth daily as needed (Heartburn). 03/22/23   [provider]  Polyvinyl Alcohol-Povidone PF 1.4-0.6 % SOLN Place 1 drop into both eyes daily as needed (Dryeyes). Refresh 08/20/19   [provider]    Family History Family History  Problem Relation Age of Onset   Hypertension Mother    Cancer - Other Mother    Lung cancer Father    Cancer - Other Brother    CVA Brother    Heart attack Brother    AAA (abdominal aortic aneurysm) Sister    Depression Sister     Social History Social History   Tobacco Use   Smoking status: Never   Smokeless tobacco: Never  Vaping Use   Vaping status: Never Used  Substance Use Topics   Alcohol use: No   Drug use: No      Allergies   Ketorolac , Morphine, Oxycodone-acetaminophen , Pentazocine, Cefuroxime, Ketorolac  tromethamine , Morphine and codeine, Nitrofurantoin , Oxycodone, Percocet [oxycodone-acetaminophen ], and Zoloft [sertraline hcl]   Review of Systems Review of Systems  Constitutional:  Negative for chills and fever.  Gastrointestinal:  Positive for abdominal pain. Negative for blood in stool, constipation, diarrhea, nausea and vomiting.  Genitourinary:  Negative for dysuria, flank pain, hematuria and vaginal discharge.  Musculoskeletal:  Positive for back pain. Negative for gait problem.     Physical Exam Triage Vital Signs ED Triage Vitals  Encounter Vitals Group     BP      Girls Systolic BP Percentile      Girls Diastolic BP Percentile      Boys Systolic BP Percentile      Boys Diastolic BP Percentile      Pulse      Resp      Temp      Temp src      SpO2      Weight      Height      Head Circumference      Peak Flow      Pain Score      Pain Loc      Pain Education      Exclude from Growth Chart    No data found.  Updated Vital Signs BP 139/76   Pulse 98   Temp 97.8 F (36.6 C)   Resp 18   SpO2 95%   Visual Acuity Right Eye Distance:   Left Eye Distance:   Bilateral Distance:    Right Eye Near:   Left Eye Near:    Bilateral Near:     Physical Exam Constitutional:      General: She is not in acute distress. HENT:     Mouth/Throat:     Mouth: Mucous membranes are moist.  Cardiovascular:     Rate and Rhythm: Normal rate and regular rhythm.  Pulmonary:     Effort: Pulmonary effort is normal. No respiratory distress.  Abdominal:     General: Bowel sounds are normal.     Palpations: Abdomen is soft.     Tenderness: There is no abdominal tenderness. There is no right CVA tenderness, left CVA tenderness, guarding or rebound.  Musculoskeletal:        General: Tenderness present. No swelling or deformity. Normal range of motion.     Comments: Mild  muscular tenderness to palpation of bilateral lower back.  Skin:    General: Skin is warm and dry.     Findings: No bruising, erythema, lesion  or rash.  Neurological:     General: No focal deficit present.     Mental Status: She is alert.     Gait: Gait normal.      UC Treatments / Results  Labs (all labs ordered are listed, but only abnormal results are displayed) Labs Reviewed  POCT URINALYSIS DIP (MANUAL ENTRY) - Abnormal; Notable for the following components:      Result Value   Bilirubin, UA small (*)    Blood, UA small (*)    Protein Ur, POC =30 (*)    Leukocytes, UA Trace (*)    All other components within normal limits  URINE CULTURE    EKG   Radiology No results found.  Procedures Procedures (including critical care time)  Medications Ordered in UC Medications - No data to display  Initial Impression / Assessment and Plan / UC Course  I have reviewed the triage vital signs and the nursing notes.  Pertinent labs & imaging results that were available during my care of the patient were reviewed by me and considered in my medical decision making (see chart for details).    Malodorous urine, low back pain, lower abdominal pain.  Afebrile and vital signs are stable.  Treating with Keflex . Urine culture pending. Discussed with patient that we will call her if the urine culture shows the need to change or discontinue the antibiotic. Tylenol  as needed.  Instructed her to follow-up with her PCP.  ED precautions given.. Patient agrees to plan of care.     Final Clinical Impressions(s) / UC Diagnoses   Final diagnoses:  Malodorous urine  Acute bilateral low back pain without sciatica  Lower abdominal pain     Discharge Instructions      Take the antibiotic as directed.  The urine culture is pending.  We will call you if it shows the need to change or discontinue your antibiotic.    Follow up with your primary care provider.  Go to the emergency department if  you have worsening symptoms.        ED Prescriptions     Medication Sig Dispense Auth. Provider   cephALEXin  (KEFLEX ) 500 MG capsule Take 1 capsule (500 mg total) by mouth 3 (three) times daily for 5 days. 15 capsule Corlis Burnard DEL, NP      PDMP not reviewed this encounter.   Corlis Burnard DEL, NP 02/29/24 [phone]

## 2024-02-29 NOTE — ED Triage Notes (Signed)
 Lower back pain, odor to urine x 3 days.

## 2024-03-03 LAB — URINE CULTURE: Culture: 20000 — AB

## 2024-03-05 ENCOUNTER — Ambulatory Visit (HOSPITAL_COMMUNITY): Payer: Self-pay

## 2024-03-29 DIAGNOSIS — Z9889 Other specified postprocedural states: Secondary | ICD-10-CM | POA: Diagnosis not present

## 2024-03-29 DIAGNOSIS — Z961 Presence of intraocular lens: Secondary | ICD-10-CM | POA: Diagnosis not present

## 2024-03-29 DIAGNOSIS — H0289 Other specified disorders of eyelid: Secondary | ICD-10-CM | POA: Diagnosis not present

## 2024-03-29 DIAGNOSIS — H538 Other visual disturbances: Secondary | ICD-10-CM | POA: Diagnosis not present

## 2024-04-11 DIAGNOSIS — R102 Pelvic and perineal pain: Secondary | ICD-10-CM | POA: Diagnosis not present

## 2024-04-11 DIAGNOSIS — K5900 Constipation, unspecified: Secondary | ICD-10-CM | POA: Diagnosis not present

## 2024-04-18 DIAGNOSIS — M25522 Pain in left elbow: Secondary | ICD-10-CM | POA: Diagnosis not present

## 2024-04-18 DIAGNOSIS — M1812 Unilateral primary osteoarthritis of first carpometacarpal joint, left hand: Secondary | ICD-10-CM | POA: Diagnosis not present

## 2024-06-05 ENCOUNTER — Ambulatory Visit
Admission: RE | Admit: 2024-06-05 | Discharge: 2024-06-05 | Disposition: A | Discharge status: Home / Self Care | Source: Ambulatory Visit | Attending: Emergency Medicine | Admitting: Emergency Medicine

## 2024-06-05 VITALS — BP 127/75 | HR 73 | Temp 97.9°F | Resp 16

## 2024-06-05 DIAGNOSIS — R300 Dysuria: Secondary | ICD-10-CM | POA: Insufficient documentation

## 2024-06-05 LAB — POCT URINE DIPSTICK
Bilirubin, UA: NEGATIVE
Glucose, UA: NEGATIVE mg/dL
Ketones, POC UA: NEGATIVE mg/dL
Nitrite, UA: NEGATIVE
POC PROTEIN,UA: 100 — AB
Spec Grav, UA: >=1.03 — AB (ref 1.010–1.025)
Urobilinogen, UA: 0.2 U/dL
pH, UA: 6 (ref 5.0–8.0)

## 2024-06-05 MED ORDER — CEPHALEXIN 500 MG PO CAPS: 500.0000 mg | ORAL_CAPSULE | Freq: Two times a day (BID) | ORAL | 0 refills | Status: AC

## 2024-06-05 MED ORDER — CEPHALEXIN 500 MG PO CAPS
500.0000 mg | ORAL_CAPSULE | Freq: Two times a day (BID) | ORAL | 0 refills | Status: DC
Start: 2024-06-05 — End: 2024-06-05

## 2024-06-05 NOTE — ED Provider Notes (Signed)
 [name]    CSN: [id number] Arrival date & time: 06/05/24  1150      History   Chief Complaint Chief Complaint  Patient presents with   Dysuria    HPI [name] is a 73 y.o. female.   Patient presents for evaluation of incomplete bladder emptying, lower abdominal pressure and dysuria beginning 1 day ago.  Has attempted use of Azo, unsure of effectiveness as she took prior to appointment today.  Denies hematuria, flank pain, fever or vaginal symptoms.  Past Medical History:  Diagnosis Date   Allergic rhinitis    Anxiety    Depression    Diverticulosis    HEMMORHOIDS ON COLONOSCOPY   DVT (deep venous thrombosis) (HCC)    IBS (irritable bowel syndrome)    DIARRHEA PRONE   OA (osteoarthritis)    HANDS   Skin cancer, basal cell    HX skin cancer per patient   TMJ syndrome    Vitamin D deficiency     Patient Active Problem List   Diagnosis Date Noted   Pain in right foot 03/10/2021   Dupuytren's disease of palm 08/14/2018   Acute pancreatitis 05/31/2016   Pancreatitis 05/31/2016   Nausea and vomiting 05/31/2016   Leukocytosis 05/31/2016   Hyperglycemia 05/31/2016    Past Surgical History:  Procedure Laterality Date   BONE SPURS REMOVAL     TOES   BREAST BIOPSY     RIGHT   BREAST ENHANCEMENT SURGERY     THEN REMOVED   CHOLECYSTECTOMY N/A 06/01/2016   Procedure: LAPAROSCOPIC CHOLECYSTECTOMY WITH INTRAOPERATIVE CHOLANGIOGRAM;  Surgeon: Vicenta Poli, MD;  Location: [facility] OR;  Service: General;  Laterality: N/A;   FOOT SURGERY     Right   IVC FILTER REMOVAL N/A 01/09/2024   Procedure: IVC FILTER REMOVAL;  Surgeon: Pearline Norman GORMAN, MD;  Location: [facility];  Service: Cardiovascular;  Laterality: N/A;   SHOULDER SURGERY     RIGHT   TUBAL LIGATION     VESICOVAGINAL FISTULA CLOSURE W/ TAH     WITHOUT OOPHS    OB History   No obstetric history on file.      Home Medications    Prior to Admission medications   Medication Sig  Start Date End Date Taking? Authorizing Provider  celecoxib (CELEBREX) 200 MG capsule Take 200 mg by mouth daily. 04/19/24  Yes [provider]  apixaban (ELIQUIS) 5 MG TABS tablet Take 5 mg by mouth. Patient not taking: Reported on 02/29/2024 10/21/23 04/18/24  [provider]  aspirin  EC 81 MG tablet Take 81 mg by mouth daily. Swallow whole.    [provider]  cephALEXin  (KEFLEX ) 500 MG capsule Take 1 capsule (500 mg total) by mouth 2 (two) times daily for 7 days. 06/05/24 06/12/24  Teresa Shelba SAUNDERS, NP  colestipol (COLESTID) 1 g tablet Take 1 g by mouth 2 (two) times daily.    [provider]  cyanocobalamin (VITAMIN B12) 1000 MCG tablet Take 1,000 mcg by mouth daily.    [provider]  Echinacea 400 MG CAPS Take 400 mg by mouth daily as needed (in the fall).    [provider]  Olopatadine-Mometasone (PARRIS) 665-25 MCG/ACT SUSP Place 1 spray into the nose 2 (two) times daily.    [provider]  omeprazole (PRILOSEC) 20 MG capsule Take 20 mg by mouth daily as needed (Heartburn). 03/22/23   [provider]  Polyvinyl Alcohol-Povidone PF 1.4-0.6 % SOLN Place 1 drop into  both eyes daily as needed (Dryeyes). Refresh 08/20/19   [provider]  VITAMIN D PO Take 5,000 Units by mouth daily.    [provider]  zinc gluconate 50 MG tablet Take 50 mg by mouth daily.    [provider]    Family History Family History  Problem Relation Age of Onset   Hypertension Mother    Cancer - Other Mother    Lung cancer Father    Cancer - Other Brother    CVA Brother    Heart attack Brother    AAA (abdominal aortic aneurysm) Sister    Depression Sister     Social History Social History   Tobacco Use   Smoking status: Never   Smokeless tobacco: Never  Vaping Use   Vaping status: Never Used  Substance Use Topics   Alcohol use: No   Drug use: No     Allergies   Ketorolac , Morphine,  Oxycodone-acetaminophen , Pentazocine, Cefuroxime, Ketorolac  tromethamine , Morphine and codeine, Nitrofurantoin , Oxycodone, Percocet [oxycodone-acetaminophen ], and Zoloft [sertraline hcl]   Review of Systems Review of Systems   Physical Exam Triage Vital Signs ED Triage Vitals  Encounter Vitals Group     BP 06/05/24 1204 127/75     Girls Systolic BP Percentile --      Girls Diastolic BP Percentile --      Boys Systolic BP Percentile --      Boys Diastolic BP Percentile --      Pulse Rate 06/05/24 1204 73     Resp 06/05/24 1204 16     Temp 06/05/24 1204 97.9 F (36.6 C)     Temp Source 06/05/24 1204 Oral     SpO2 06/05/24 1204 99 %     Weight --      Height --      Head Circumference --      Peak Flow --      Pain Score 06/05/24 1202 0     Pain Loc --      Pain Education --      Exclude from Growth Chart --    No data found.  Updated Vital Signs BP 127/75 (BP Location: Left Arm)   Pulse 73   Temp 97.9 F (36.6 C) (Oral)   Resp 16   SpO2 99%   Visual Acuity Right Eye Distance:   Left Eye Distance:   Bilateral Distance:    Right Eye Near:   Left Eye Near:    Bilateral Near:     Physical Exam Constitutional:      Appearance: Normal appearance.  Eyes:     Extraocular Movements: Extraocular movements intact.  Pulmonary:     Effort: Pulmonary effort is normal.  Abdominal:     Tenderness: There is no abdominal tenderness. There is no right CVA tenderness, left CVA tenderness or guarding.  Neurological:     Mental Status: She is alert and oriented to person, place, and time. Mental status is at baseline.      UC Treatments / Results  Labs (all labs ordered are listed, but only abnormal results are displayed) Labs Reviewed  POCT URINE DIPSTICK - Abnormal; Notable for the following components:      Result Value   Clarity, UA cloudy (*)    Spec Grav, UA >=1.030 (*)    Blood, UA large (*)    POC PROTEIN,UA =100 (*)    Leukocytes, UA Trace (*)    All other  components within normal limits  URINE CULTURE  EKG   Radiology No results found.  Procedures Procedures (including critical care time)  Medications Ordered in UC Medications - No data to display  Initial Impression / Assessment and Plan / UC Course  I have reviewed the triage vital signs and the nursing notes.  Pertinent labs & imaging results that were available during my care of the patient were reviewed by me and considered in my medical decision making (see chart for details).   Dysuria  Urinalysis showing leukocytes and blood, negative for nitrates, sent for culture, discussed findings with patient, empirically placed on cephalexin  as she is symptomatic, antibiotic today chosen based on last culture results in July 2025.  Recommend over-the-counter medications and nonpharmacological supportive care with follow-up if symptoms continue to persist Final Clinical Impressions(s) / UC Diagnoses   Final diagnoses:  Dysuria     Discharge Instructions      Your urinalysis shows Vedanth Sirico blood cells and blood but at this time does not show bacteria, your urine will be sent to the lab to determine exactly which bacteria is present, if any changes need to be made to your medications you will be notified  Begin use of cephalexin  twice a day for 7 days  You may use over-the-counter Azo to help minimize your symptoms until antibiotic removes bacteria, this medication will turn your urine orange  Increase your fluid intake through use of water  As always practice good hygiene, wiping front to back and avoidance of scented vaginal products to prevent further irritation  If symptoms continue to persist after use of medication or recur please follow-up with urgent care or your primary doctor as needed    ED Prescriptions     Medication Sig Dispense Auth. Provider   cephALEXin  (KEFLEX ) 500 MG capsule  (Status: Discontinued) Take 1 capsule (500 mg total) by mouth 2 (two) times  daily for 7 days. 14 capsule Carri Spillers R, NP   cephALEXin  (KEFLEX ) 500 MG capsule Take 1 capsule (500 mg total) by mouth 2 (two) times daily for 7 days. 14 capsule Brandye Inthavong, Shelba SAUNDERS, NP      PDMP not reviewed this encounter.   Teresa Shelba SAUNDERS, NP 06/05/24 1229

## 2024-06-05 NOTE — ED Triage Notes (Signed)
 Patient complains of difficulty urinating, and painful urination that started yesterday. Patient took AZO today for symptoms with no relief.

## 2024-06-05 NOTE — Discharge Instructions (Addendum)
 Your urinalysis shows Orie Cuttino blood cells and blood but at this time does not show bacteria, your urine will be sent to the lab to determine exactly which bacteria is present, if any changes need to be made to your medications you will be notified  Begin use of cephalexin  twice a day for 7 days  You may use over-the-counter Azo to help minimize your symptoms until antibiotic removes bacteria, this medication will turn your urine orange  Increase your fluid intake through use of water  As always practice good hygiene, wiping front to back and avoidance of scented vaginal products to prevent further irritation  If symptoms continue to persist after use of medication or recur please follow-up with urgent care or your primary doctor as needed

## 2024-06-07 LAB — URINE CULTURE: Culture: >=100000 — AB

## 2024-06-08 ENCOUNTER — Ambulatory Visit (HOSPITAL_COMMUNITY): Payer: Self-pay
